# Patient Record
Sex: Male | Born: 1985
Health system: Southern US, Community
[De-identification: ages and names within clinical notes are randomized; demographics above are authoritative.]

## PROBLEM LIST (undated history)

## (undated) DIAGNOSIS — T7840XA Allergy, unspecified, initial encounter: Secondary | ICD-10-CM

## (undated) DIAGNOSIS — F419 Anxiety disorder, unspecified: Secondary | ICD-10-CM

## (undated) DIAGNOSIS — K219 Gastro-esophageal reflux disease without esophagitis: Secondary | ICD-10-CM

## (undated) HISTORY — DX: Gastro-esophageal reflux disease without esophagitis: K21.9

## (undated) HISTORY — DX: Allergy, unspecified, initial encounter: T78.40XA

## (undated) HISTORY — PX: HERNIA REPAIR: SHX51

## (undated) HISTORY — DX: Anxiety disorder, unspecified: F41.9

---
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
## 2011-09-14 ENCOUNTER — Encounter: Payer: Self-pay | Admitting: Family Medicine

## 2011-09-14 DIAGNOSIS — F419 Anxiety disorder, unspecified: Secondary | ICD-10-CM

## 2011-10-04 ENCOUNTER — Ambulatory Visit: Payer: Self-pay | Admitting: Family Medicine

## 2011-11-14 ENCOUNTER — Ambulatory Visit: Payer: 59

## 2011-11-14 ENCOUNTER — Ambulatory Visit (INDEPENDENT_AMBULATORY_CARE_PROVIDER_SITE_OTHER): Payer: 59 | Admitting: Family Medicine

## 2011-11-14 DIAGNOSIS — F419 Anxiety disorder, unspecified: Secondary | ICD-10-CM

## 2011-11-14 DIAGNOSIS — F411 Generalized anxiety disorder: Secondary | ICD-10-CM

## 2011-11-14 DIAGNOSIS — R079 Chest pain, unspecified: Secondary | ICD-10-CM

## 2011-11-14 MED ORDER — CLONAZEPAM 0.5 MG PO TABS: 0.5000 mg | ORAL_TABLET | Freq: Two times a day (BID) | ORAL | Status: DC | PRN

## 2011-11-14 NOTE — Progress Notes (Signed)
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  No known injury.  Current smoker.  No h/o asthma.  O:   NAD Heart:  No murmur, gallop, rub Chest:  Clear to auscultation, nontender.  [facility] reading (PRIMARY) by  Dr. Milus Glazier:  Neg CXR  A:  Chronic mild chest pain  P:  Try quitting smoking and see what happens.  Patient also complained about some anxiety and angst. His girlfriend has Xanax but does not share with him. Clonazepam in the past which helped some but it interferes with his memory. He is much exactly sure what that is. Discussing what he likes the most which turns out to be racing his car. After further discussion he said that he would like to try the clonazepam one more month and come back and discuss further therapy.  I agreed to refill clonazepam 0.5 #30 one daily when necessary as long as he would come back

## 2012-02-14 ENCOUNTER — Other Ambulatory Visit: Payer: Self-pay

## 2012-02-14 NOTE — Telephone Encounter (Signed)
Pt would like to have a refill on Klonopin.

## 2012-02-14 NOTE — Telephone Encounter (Signed)
PT CALLED EARLIER TODAY AND IS CALLING BACK REGARDING HIS [facility] CALL [phone]

## 2012-02-15 NOTE — Telephone Encounter (Signed)
Per Dr. Loma Boston last note, he needs to be seen for further refills

## 2012-02-16 NOTE — Telephone Encounter (Signed)
LMOM THAT PT NEEDS TO RTC

## 2013-11-25 ENCOUNTER — Encounter: Payer: Self-pay | Admitting: Physician Assistant

## 2013-11-25 ENCOUNTER — Ambulatory Visit (INDEPENDENT_AMBULATORY_CARE_PROVIDER_SITE_OTHER): Payer: 59 | Admitting: Physician Assistant

## 2013-11-25 VITALS — BP 134/81 | HR 100 | Temp 98.4°F | Resp 18 | Ht 71.5 in | Wt 215.0 lb

## 2013-11-25 DIAGNOSIS — H9203 Otalgia, bilateral: Secondary | ICD-10-CM

## 2013-11-25 DIAGNOSIS — H6980 Other specified disorders of Eustachian tube, unspecified ear: Secondary | ICD-10-CM

## 2013-11-25 DIAGNOSIS — H9209 Otalgia, unspecified ear: Secondary | ICD-10-CM

## 2013-11-25 MED ORDER — FLUTICASONE PROPIONATE 50 MCG/ACT NA SUSP: 2.0000 | Freq: Every day | NASAL | Status: DC

## 2013-11-25 NOTE — Progress Notes (Signed)
   Subjective:    Patient ID: Brandon David, male    DOB: Jan 18, 1986, 28 y.o.   MRN: [id number]   PCP: No primary provider on file.  Chief Complaint  Patient presents with  . Otalgia    bilateral x 8 weeks    Medications, allergies, past medical history, surgical history, family history, social history and problem list reviewed and updated.  HPI  8-9 weeks ago started noticing ears were itching. Used Q-tips to scratch inside the canal, and has seen blood. Sometimes has wax build up, but hasn't ever had to have his ears irrigated or debrided of cerumen. No trouble hearing. No drainage from the ears. No nasal/sinus congestion that he notes. Sometimes feels like the canals are a little swollen (ear buds are tight) Ears pop every now and again. "I've gotten pretty used to it," but not normal for him. "When I drink cold fluids, it hurts in the pit of my ears." Sees DDS regularly, and uses toothpaste for sensitive teeth.  Review of Systems No fever, chills, GI/GU symptoms.  No dizziness.    Objective:   Physical Exam  Vitals reviewed. Constitutional: He is oriented to person, place, and time. Vital signs are normal. He appears well-developed and well-nourished. He is active and cooperative. No distress.  BP 134/81  Pulse 100  Temp(Src) 98.4 F (36.9 C)  Resp 18  Ht 5' 11.5" (1.816 m)  Wt 215 lb (97.523 kg)  BMI 29.57 kg/m2  SpO2 96%  HENT:  Head: Normocephalic and atraumatic.  Right Ear: Hearing, tympanic membrane, external ear and ear canal normal.  Left Ear: Hearing, tympanic membrane, external ear and ear canal normal.  Nose: Nose normal.  Mouth/Throat: Uvula is midline, oropharynx is clear and moist and mucous membranes are normal.  Eyes: Conjunctivae are normal. No scleral icterus.  Neck: Normal range of motion. Neck supple. No thyromegaly present.  Cardiovascular: Normal rate, regular rhythm and normal heart sounds.   Pulses:      Radial pulses are 2+ on the  right side, and 2+ on the left side.  Pulmonary/Chest: Effort normal and breath sounds normal.  Lymphadenopathy:       Head (right side): No tonsillar, no preauricular, no posterior auricular and no occipital adenopathy present.       Head (left side): No tonsillar, no preauricular, no posterior auricular and no occipital adenopathy present.    He has no cervical adenopathy.       Right: No supraclavicular adenopathy present.       Left: No supraclavicular adenopathy present.  Neurological: He is alert and oriented to person, place, and time. No sensory deficit.  Skin: Skin is warm, dry and intact. No rash noted. No cyanosis or erythema. Nails show no clubbing.  Psychiatric: He has a normal mood and affect.          Assessment & Plan:  1. Otalgia of both ears No evidence of infection. Encouraged health year care, discontinuation of Q-tip use in the ear canal.  Alternatives for itchy ears encouraged.  2. ETD (eustachian tube dysfunction) Likely the cause of his discomfort.  Suspect regular use of steroid nasal spray will resolve these symptoms.  If not, consider short course of oral steroids. - fluticasone (FLONASE) 50 MCG/ACT nasal spray; Place 2 sprays into both nostrils daily.  Dispense: 16 g; Refill: 12   Fernande Brashelle S. Yancy Knoble, PA-C Physician Assistant-Certified [facility]

## 2013-11-25 NOTE — Patient Instructions (Signed)
Use the the zyrtec daily. Use the Flonase daily. When the symptoms improve, you can reduce the Zyrtec, and use it only when needed.  THE 3 SIMPLE RULES FOR NASAL SPRAY USE: 1. Don't snort. 2. Look at your toes and spray up your nose. 3. Use the opposite hand to spray in both nostrils.  Please do not insert anything into your ear that is smaller than your elbow.  This includes QTips, keys, hair pins, etc.   If your ears produce extra wax, you can help reduce the build up by:  1) allow the soapy water to drip down into your ear canals when you wash your hair (it can dissolve the wax the same way that dishwashing liquid dissolves grease), and/or  2) mix Hydrogen Peroxide half-and-half with water (DO NOT USE HYDROGEN PEROXIDE WITHOUT DILUTING IT WITH WATER as it can burn the skin in your ear); pour a little of this mixture into each ear canal while in the shower 2-3 times each week.  If your ears are itchy, place several drops of Sweet Oil into each canal to soothe the itching.  If it's not effective, place a small amount of hydrocortisone ointment on the tip of your pinky finger and rub it gently in the ear canal.  The heat of your body will melt the ointment, allowing it to spread in your ear canal and reduce the itching.

## 2017-10-21 ENCOUNTER — Emergency Department (HOSPITAL_COMMUNITY): Payer: Managed Care, Other (non HMO)

## 2017-10-21 ENCOUNTER — Encounter (HOSPITAL_COMMUNITY): Payer: Self-pay | Admitting: Emergency Medicine

## 2017-10-21 ENCOUNTER — Other Ambulatory Visit: Payer: Self-pay

## 2017-10-21 DIAGNOSIS — R079 Chest pain, unspecified: Secondary | ICD-10-CM | POA: Diagnosis present

## 2017-10-21 DIAGNOSIS — K219 Gastro-esophageal reflux disease without esophagitis: Secondary | ICD-10-CM | POA: Insufficient documentation

## 2017-10-21 LAB — CBC
HEMATOCRIT: 45.1 % (ref 39.0–52.0)
Hemoglobin: 15 g/dL (ref 13.0–17.0)
MCH: 28 pg (ref 26.0–34.0)
MCHC: 33.3 g/dL (ref 30.0–36.0)
MCV: 84.1 fL (ref 78.0–100.0)
Platelets: 203 10*3/uL (ref 150–400)
RBC: 5.36 MIL/uL (ref 4.22–5.81)
RDW: 13.4 % (ref 11.5–15.5)
WBC: 7.5 10*3/uL (ref 4.0–10.5)

## 2017-10-21 LAB — BASIC METABOLIC PANEL
Anion gap: 15 (ref 5–15)
BUN: 14 mg/dL (ref 6–20)
CO2: 22 mmol/L (ref 22–32)
Calcium: 9.7 mg/dL (ref 8.9–10.3)
Chloride: 102 mmol/L (ref 101–111)
Creatinine, Ser: 0.99 mg/dL (ref 0.61–1.24)
GFR calc Af Amer: >60 mL/min (ref >60)
GFR calc non Af Amer: >60 mL/min (ref >60)
GLUCOSE: 94 mg/dL (ref 65–99)
POTASSIUM: 3.7 mmol/L (ref 3.5–5.1)
Sodium: 139 mmol/L (ref 135–145)

## 2017-10-21 LAB — I-STAT TROPONIN, ED: Troponin i, poc: 0 ng/mL (ref 0.00–0.08)

## 2017-10-21 NOTE — ED Triage Notes (Signed)
Pt reports left sided chest pain for the past couple of weeks usually after eating.  Feels like pressure or sometimes sharp.  Denies LOC, SOB, dizziness, N/V or sweating.  Pt does have hx of GERD but is "not sure that's what it is"

## 2017-10-22 ENCOUNTER — Emergency Department (HOSPITAL_COMMUNITY)
Admission: EM | Admit: 2017-10-22 | Discharge: 2017-10-22 | Disposition: A | Discharge status: Home / Self Care | Payer: Managed Care, Other (non HMO) | Attending: Emergency Medicine | Admitting: Emergency Medicine

## 2017-10-22 DIAGNOSIS — K219 Gastro-esophageal reflux disease without esophagitis: Secondary | ICD-10-CM

## 2017-10-22 LAB — HEPATIC FUNCTION PANEL
ALK PHOS: 63 U/L (ref 38–126)
ALT: 32 U/L (ref 17–63)
AST: 30 U/L (ref 15–41)
Albumin: 4.7 g/dL (ref 3.5–5.0)
BILIRUBIN TOTAL: 0.5 mg/dL (ref 0.3–1.2)
Total Protein: 8 g/dL (ref 6.5–8.1)

## 2017-10-22 LAB — LIPASE, BLOOD: Lipase: 26 U/L (ref 11–51)

## 2017-10-22 MED ORDER — PANTOPRAZOLE SODIUM 40 MG PO TBEC
40.0000 mg | DELAYED_RELEASE_TABLET | Freq: Once | ORAL | Status: AC
  Administered 2017-10-22: 40 mg via ORAL
  Filled 2017-10-22: qty 1

## 2017-10-22 MED ORDER — SUCRALFATE 1 G PO TABS: 1.0000 g | ORAL_TABLET | Freq: Three times a day (TID) | ORAL | 0 refills | Status: DC

## 2017-10-22 MED ORDER — PANTOPRAZOLE SODIUM 20 MG PO TBEC: 40.0000 mg | DELAYED_RELEASE_TABLET | Freq: Every day | ORAL | 0 refills | Status: DC

## 2017-10-22 NOTE — ED Provider Notes (Signed)
[facility] EMERGENCY DEPARTMENT Provider Note   CSN: [id number] Arrival date & time: 10/21/17  2053     History   Chief Complaint Chief Complaint  Patient presents with  . Chest Pain    HPI Brandon David is a 32 y.o. male.  32 year old male with a history of allergies, anxiety, reflux presents to the emergency department for evaluation of chest pain.  He has had waxing and waning chest pain over the past few weeks, since he believes that his Prilosec stopped working as efficiently for him.  He has changed to different medications including Pepcid and Zantac with little improvement to his chest pain.  He describes it as burning and located in his left upper abdomen and left lower chest.  Symptoms are aggravated with eating and drinking and making his stomach feel full.  He has not had any nausea or vomiting.  No bowel changes, fevers.  No associated shortness of breath, syncope, diaphoresis.  He is scheduled to see a primary care doctor and follow-up in 2 days.  Gastroenterologist.   The history is provided by the patient. No language interpreter was used.    Past Medical History:  Diagnosis Date  . Allergy   . Anxiety   . GERD (gastroesophageal reflux disease)     Patient Active Problem List   Diagnosis Date Noted  . Anxiety 09/14/2011    Class: Acute    Past Surgical History:  Procedure Laterality Date  . HERNIA REPAIR         Home Medications    Prior to Admission medications   Medication Sig Start Date End Date Taking? Authorizing Provider  cetirizine (ZYRTEC) 10 MG tablet Take 10 mg by mouth daily as needed for allergies.    [provider]  fluticasone (FLONASE) 50 MCG/ACT nasal spray Place 2 sprays into both nostrils daily. 11/25/13   Porfirio Oar, PA-C  pantoprazole (PROTONIX) 20 MG tablet Take 2 tablets (40 mg total) by mouth daily. 10/22/17   Antony Madura, PA-C  sucralfate (CARAFATE) 1 g tablet Take 1 tablet (1 g total) by  mouth 4 (four) times daily -  with meals and at bedtime. 10/22/17   Antony Madura, PA-C    Family History Family History  Problem Relation Age of Onset  . Cancer Maternal Grandmother   . COPD Maternal Grandfather   . Heart disease Paternal Grandfather     Social History Social History   Tobacco Use  . Smoking status: Former Smoker    Packs/day: 0.75    Types: Cigarettes    Last attempt to quit: 06/16/2002    Years since quitting: 15.3  . Smokeless tobacco: Never Used  Substance Use Topics  . Alcohol use: Yes    Alcohol/week: 5.0 - 10.0 oz    Types: 10 - 20 Standard drinks or equivalent per week  . Drug use: Yes    Frequency: 7.0 times per week    Types: Marijuana     Allergies   Patient has no known allergies.   Review of Systems Review of Systems Ten systems reviewed and are negative for acute change, except as noted in the HPI.    Physical Exam Updated Vital Signs BP (!) 126/93   Pulse (!) 52   Temp 97.8 F (36.6 C) (Oral)   Resp 15   Ht 6' (1.829 m)   Wt 99.8 kg (220 lb)   SpO2 99%   BMI 29.84 kg/m   Physical Exam  Constitutional: He  is oriented to person, place, and time. He appears well-developed and well-nourished. No distress.  Nontoxic appearing and in no acute distress  HENT:  Head: Normocephalic and atraumatic.  Eyes: Conjunctivae and EOM are normal. No scleral icterus.  Neck: Normal range of motion.  Cardiovascular: Normal rate, regular rhythm and intact distal pulses.  Pulmonary/Chest: Effort normal. No stridor. No respiratory distress. He has no wheezes. He has no rales.  Lungs clear to auscultation bilaterally  Abdominal: Soft. He exhibits no distension.  Musculoskeletal: Normal range of motion.  Neurological: He is alert and oriented to person, place, and time. He exhibits normal muscle tone. Coordination normal.  Ambulatory with steady gait  Skin: Skin is warm and dry. No rash noted. He is not diaphoretic. No erythema. No pallor.    Psychiatric: He has a normal mood and affect. His behavior is normal.  Nursing note and vitals reviewed.    ED Treatments / Results  Labs (all labs ordered are listed, but only abnormal results are displayed) Labs Reviewed  HEPATIC FUNCTION PANEL - Abnormal; Notable for the following components:      Result Value   Bilirubin, Direct <0.1 (*)    All other components within normal limits  BASIC METABOLIC PANEL  CBC  LIPASE, BLOOD  I-STAT TROPONIN, ED    EKG  EKG Interpretation  Date/Time:  Wednesday October 22 2017 04:11:55 EST Ventricular Rate:  50 PR Interval:  178 QRS Duration: 127 QT Interval:  427 QTC Calculation: 390 R Axis:   8 Text Interpretation:  Sinus rhythm nonspecific IV conduction delay Confirmed by Zadie Rhine ([id number]) on 10/22/2017 4:18:18 AM       Radiology Dg Chest 2 View  Result Date: 10/21/2017 CLINICAL DATA:  Chest pain EXAM: CHEST  2 VIEW COMPARISON:  November 14, 2011 FINDINGS: There is no edema or consolidation. The heart size and pulmonary vascularity are normal. No adenopathy. No pneumothorax. No bone lesions. IMPRESSION: No edema or consolidation. Electronically Signed   By: Bretta Bang III M.D.   On: 10/21/2017 21:42    Procedures Procedures (including critical care time)  Medications Ordered in ED Medications  pantoprazole (PROTONIX) EC tablet 40 mg (40 mg Oral Given 10/22/17 0415)     Initial Impression / Assessment and Plan / ED Course  I have reviewed the triage vital signs and the nursing notes.  Pertinent labs & imaging results that were available during my care of the patient were reviewed by me and considered in my medical decision making (see chart for details).     32 year old male presents to the emergency department for evaluation of left-sided chest pain which has been present intermittently over the past few weeks.  He has history of reflux and notes worsening pain after eating or drinking, making him feel full.   Patient previously on Prilosec, but feels this has been less helpful lately.  His symptoms are atypical for cardiac etiology.  Given chronicity and reassuring workup today, low suspicion for emergent cardiac process.  EKG is without signs of acute ischemia and troponin is negative.  Remainder of his laboratory workup has also been noncontributory.  Patient received a GI cocktail at urgent care.  He reports some improvement with this.  Will start on Protonix and Carafate.  The patient has been instructed to follow-up with a primary care doctor in 2 days at his scheduled appointment.  Return precautions discussed and provided. Patient discharged in stable condition with no unaddressed concerns.   Final Clinical Impressions(s) /  ED Diagnoses   Final diagnoses:  Gastroesophageal reflux disease, esophagitis presence not specified    ED Discharge Orders        Ordered    pantoprazole (PROTONIX) 20 MG tablet  Daily     10/22/17 0357    sucralfate (CARAFATE) 1 g tablet  3 times daily with meals & bedtime     10/22/17 0357       Antony MaduraHumes, Ohm Dentler, PA-C 10/22/17 0459    Zadie RhineWickline, Donald, MD 10/22/17 [phone]

## 2017-10-23 ENCOUNTER — Other Ambulatory Visit: Payer: Self-pay

## 2017-10-23 ENCOUNTER — Ambulatory Visit: Payer: Managed Care, Other (non HMO) | Admitting: Family Medicine

## 2017-10-23 ENCOUNTER — Encounter: Payer: Self-pay | Admitting: Family Medicine

## 2017-10-23 VITALS — BP 130/84 | HR 80 | Temp 97.8°F | Ht 71.65 in | Wt 182.0 lb

## 2017-10-23 DIAGNOSIS — K219 Gastro-esophageal reflux disease without esophagitis: Secondary | ICD-10-CM

## 2017-10-23 NOTE — Patient Instructions (Signed)
     IF you received an x-ray today, you will receive an invoice from [facility]. Please contact [facility] at [phone] with questions or concerns regarding your invoice.   IF you received labwork today, you will receive an invoice from [facility]. Please contact [facility] at [phone] with questions or concerns regarding your invoice.   Our billing staff will not be able to assist you with questions regarding bills from these companies.  You will be contacted with the lab results as soon as they are available. The fastest way to get your results is to activate your My Chart account. Instructions are located on the last page of this paperwork. If you have not heard from us regarding the results in 2 weeks, please contact this office.     

## 2017-10-23 NOTE — Progress Notes (Signed)
2/21/20195:50 PM  Brandon David 05/14/1986, 32 y.o. male [id number]  Chief Complaint  Patient presents with  . Pain    has been having chest pain, has release records from [facility] diagnosed acid reflex. Says he been eating spicy food    HPI:   Patient is a 32 y.o. male with past medical history significant for GERD who presents today for ER follow-up  Seen yesterday for overall 3 weeks worsening reflux He describes his pain as mostly pressure on LLUQ that radiates up into his chest, like gas pain It started after eating lots of spicy food on the superbowl Long standing reflux, used to be well controlled with omeprazole but had not been working since this last episode Went to ER, cardiac and GI eval neg, slightly better with GI cocktail DC on pantoprazole 20mg  BID and sulcralfate QID Today he is doing better, has been able to have breakfast and lunch without reflux afterwards He does not smoke, has reduced alcohol significantly, has not had any increase in NSAID use He knows his food triggers well and has been working on his diet and losing weight He does not tend to get many nocturnal symptoms He denies any unintentional weight loss, epigastric pain, black tarry stools, diarrhea. Has had some mild constipation but that seems to be resolving, normally not an issue Has never had an UGI series or EGD  Depression screen [facility] 2/9 10/23/2017  Decreased Interest 0  Down, Depressed, Hopeless 0  PHQ - 2 Score 0    No Known Allergies  Prior to Admission medications   Medication Sig Start Date End Date Taking? Authorizing Provider  cetirizine (ZYRTEC) 10 MG tablet Take 10 mg by mouth daily as needed for allergies.   Yes [provider]  fluticasone (FLONASE) 50 MCG/ACT nasal spray Place 2 sprays into both nostrils daily. 11/25/13  Yes Jeffery, Chelle, PA-C  pantoprazole (PROTONIX) 20 MG tablet Take 2 tablets (40 mg total) by mouth daily. 10/22/17  Yes Antony Madura, PA-C    sucralfate (CARAFATE) 1 g tablet Take 1 tablet (1 g total) by mouth 4 (four) times daily -  with meals and at bedtime. 10/22/17  Yes Antony Madura, PA-C    Past Medical History:  Diagnosis Date  . Allergy   . Anxiety   . GERD (gastroesophageal reflux disease)     Past Surgical History:  Procedure Laterality Date  . HERNIA REPAIR      Social History   Tobacco Use  . Smoking status: Former Smoker    Packs/day: 0.75    Types: Cigarettes    Last attempt to quit: 06/16/2002    Years since quitting: 15.3  . Smokeless tobacco: Never Used  Substance Use Topics  . Alcohol use: Yes    Alcohol/week: 5.0 - 10.0 oz    Types: 10 - 20 Standard drinks or equivalent per week    Family History  Problem Relation Age of Onset  . Cancer Maternal Grandmother   . COPD Maternal Grandfather   . Heart disease Paternal Grandfather     Review of Systems  Constitutional: Negative for chills and fever.  Respiratory: Negative for cough and shortness of breath.   Cardiovascular: Negative for chest pain, palpitations and leg swelling.  Gastrointestinal: Positive for constipation and heartburn. Negative for abdominal pain, blood in stool, diarrhea, melena, nausea and vomiting.  Genitourinary: Negative for dysuria and hematuria.   Per hpi  OBJECTIVE:  Blood pressure 130/84, pulse 80, temperature 97.8 F (36.6  C), temperature source Oral, height 5' 11.65" (1.82 m), weight 182 lb (82.6 kg).  Physical Exam  Constitutional: He is oriented to person, place, and time and well-developed, well-nourished, and in no distress.  HENT:  Head: Normocephalic and atraumatic.  Mouth/Throat: Oropharynx is clear and moist.  Eyes: EOM are normal. Pupils are equal, round, and reactive to light.  Neck: Neck supple.  Cardiovascular: Normal rate and regular rhythm. Exam reveals no gallop and no friction rub.  No murmur heard. Pulmonary/Chest: Effort normal and breath sounds normal. He has no wheezes. He has no  rales.  Abdominal: Soft. Bowel sounds are normal. He exhibits no distension. There is no hepatosplenomegaly. There is no tenderness.  Neurological: He is alert and oriented to person, place, and time. Gait normal.  Skin: Skin is warm and dry.    ASSESSMENT and PLAN  1. Gastroesophageal reflux disease, esophagitis presence not specified Doing better on current regime. Spent more than 50% of this 25 minute visit in education and coordination of care. Discussed LFM and options for future management. Discussed weaning of sucralfate once symptoms resolved, consider UGI vs GI referral.   Return if symptoms worsen or fail to improve.    Myles LippsIrma M Santiago, MD Primary Care at [facility] [address] [place], [place] [address] Ph.  [phone] Fax [phone]

## 2017-10-24 ENCOUNTER — Encounter: Payer: Self-pay | Admitting: Family Medicine

## 2017-10-24 DIAGNOSIS — K219 Gastro-esophageal reflux disease without esophagitis: Secondary | ICD-10-CM | POA: Insufficient documentation

## 2017-10-31 ENCOUNTER — Ambulatory Visit: Payer: Managed Care, Other (non HMO) | Admitting: Family Medicine

## 2017-10-31 ENCOUNTER — Encounter: Payer: Self-pay | Admitting: Family Medicine

## 2017-10-31 VITALS — BP 132/85 | HR 94 | Temp 97.8°F | Resp 16 | Ht 71.0 in | Wt 213.0 lb

## 2017-10-31 DIAGNOSIS — K219 Gastro-esophageal reflux disease without esophagitis: Secondary | ICD-10-CM | POA: Diagnosis not present

## 2017-10-31 NOTE — Patient Instructions (Signed)
     IF you received an x-ray today, you will receive an invoice from [facility]. Please contact [facility] at [phone] with questions or concerns regarding your invoice.   IF you received labwork today, you will receive an invoice from [facility]. Please contact [facility] at [phone] with questions or concerns regarding your invoice.   Our billing staff will not be able to assist you with questions regarding bills from these companies.  You will be contacted with the lab results as soon as they are available. The fastest way to get your results is to activate your My Chart account. Instructions are located on the last page of this paperwork. If you have not heard from us regarding the results in 2 weeks, please contact this office.     

## 2017-10-31 NOTE — Progress Notes (Signed)
3/1/20198:17 AM  Brandon David [birth year], 32 y.o. male [id number]  Chief Complaint  Patient presents with  . Gastroesophageal Reflux    follow up/ pt states he feels better, pt states he did eat breakfast yesterday and felt light head that afternoon, different for him    HPI:   Patient is a 32 y.o. male with past medical history significant for GERD who presents today for followup On protonix BID Had to stop carafate as it was causing worsening of symptoms in the evening Overall doing better but yesterday had an episode late morning when he felt suddenly very tired, lightheaded, nauseous. Sat down for about 30 mins, eventually had crackers and water, symptoms resolved and was able to finish his day wo issues, he had his regular breakfast that day He also has noticed on several occassions evening symptoms about an hour after he has dinner, he denies nocturnal symptoms He is wondering if he should see GI  Depression screen PHQ 2/9 10/23/2017  Decreased Interest 0  Down, Depressed, Hopeless 0  PHQ - 2 Score 0    No Known Allergies  Prior to Admission medications   Medication Sig Start Date End Date Taking? Authorizing Provider  cetirizine (ZYRTEC) 10 MG tablet Take 10 mg by mouth daily as needed for allergies.   Yes [provider]  fluticasone (FLONASE) 50 MCG/ACT nasal spray Place 2 sprays into both nostrils daily. 11/25/13  Yes Jeffery, Chelle, PA-C  pantoprazole (PROTONIX) 20 MG tablet Take 2 tablets (40 mg total) by mouth daily. 10/22/17  Yes Antony MaduraHumes, Kelly, PA-C  sucralfate (CARAFATE) 1 g tablet Take 1 tablet (1 g total) by mouth 4 (four) times daily -  with meals and at bedtime. Patient not taking: Reported on 10/31/2017 10/22/17   Antony MaduraHumes, Kelly, PA-C    Past Medical History:  Diagnosis Date  . Allergy   . Anxiety   . GERD (gastroesophageal reflux disease)     Past Surgical History:  Procedure Laterality Date  . HERNIA REPAIR      Social History   Tobacco  Use  . Smoking status: Former Smoker    Packs/day: 0.75    Types: Cigarettes    Last attempt to quit: 06/16/2002    Years since quitting: 15.3  . Smokeless tobacco: Never Used  Substance Use Topics  . Alcohol use: Yes    Alcohol/week: 5.0 - 10.0 oz    Types: 10 - 20 Standard drinks or equivalent per week    Family History  Problem Relation Age of Onset  . Cancer Maternal Grandmother   . COPD Maternal Grandfather   . Heart disease Paternal Grandfather     ROS Per hpi  OBJECTIVE:  Blood pressure 132/85, pulse 94, temperature 97.8 F (36.6 C), temperature source Oral, resp. rate 16, height 5\' 11"  (1.803 m), weight 213 lb (96.6 kg), SpO2 97 %.  Physical Exam  Constitutional: He is oriented to person, place, and time and well-developed, well-nourished, and in no distress.  HENT:  Head: Normocephalic and atraumatic.  Mouth/Throat: Oropharynx is clear and moist.  Eyes: EOM are normal. Pupils are equal, round, and reactive to light.  Neck: Neck supple.  Pulmonary/Chest: Effort normal.  Neurological: He is alert and oriented to person, place, and time. Gait normal.  Skin: Skin is warm and dry.  Psychiatric: Mood and affect normal.  Nursing note and vitals reviewed.    ASSESSMENT and PLAN  1. Gastroesophageal reflux disease, esophagitis presence not specified Cont protonix, discussed LFM,  discussed small regular meals and increasing hydration.  - Ambulatory referral to Gastroenterology  Return if symptoms worsen or fail to improve.    Myles Lipps, MD Primary Care at [facility] [address] [place], [place] [address] Ph.  [phone] Fax [phone]

## 2017-11-17 ENCOUNTER — Telehealth: Payer: Self-pay | Admitting: Family Medicine

## 2017-11-17 NOTE — Telephone Encounter (Signed)
Copied from CRM [id number]. Topic: General - Other >> Nov 17, 2017 12:22 PM Cecelia ByarsGreen, Pama Roskos L, RMA wrote: Reason for CRM: Please re fax Referral # [id number] to fax# [phone]

## 2017-11-20 ENCOUNTER — Other Ambulatory Visit: Payer: Self-pay | Admitting: Gastroenterology

## 2017-11-20 DIAGNOSIS — R1011 Right upper quadrant pain: Secondary | ICD-10-CM

## 2017-11-28 ENCOUNTER — Encounter (HOSPITAL_COMMUNITY): Payer: Managed Care, Other (non HMO)

## 2017-11-28 ENCOUNTER — Ambulatory Visit (HOSPITAL_COMMUNITY): Payer: Managed Care, Other (non HMO)

## 2017-12-02 ENCOUNTER — Encounter (HOSPITAL_COMMUNITY)
Admission: RE | Admit: 2017-12-02 | Discharge: 2017-12-02 | Disposition: A | Discharge status: Home / Self Care | Payer: Managed Care, Other (non HMO) | Source: Ambulatory Visit | Attending: Gastroenterology | Admitting: Gastroenterology

## 2017-12-02 DIAGNOSIS — R1011 Right upper quadrant pain: Secondary | ICD-10-CM | POA: Diagnosis present

## 2017-12-02 MED ORDER — TECHNETIUM TC 99M MEBROFENIN IV KIT
4.9000 | PACK | Freq: Once | INTRAVENOUS | Status: AC | PRN
  Administered 2017-12-02: 4.9 via INTRAVENOUS

## 2017-12-17 ENCOUNTER — Other Ambulatory Visit: Payer: Self-pay | Admitting: Surgery

## 2018-01-31 HISTORY — PX: LAPAROSCOPIC CHOLECYSTECTOMY: SUR755

## 2018-04-28 ENCOUNTER — Ambulatory Visit (INDEPENDENT_AMBULATORY_CARE_PROVIDER_SITE_OTHER): Payer: Managed Care, Other (non HMO) | Admitting: Family Medicine

## 2018-04-28 ENCOUNTER — Other Ambulatory Visit: Payer: Self-pay

## 2018-04-28 ENCOUNTER — Encounter: Payer: Self-pay | Admitting: Family Medicine

## 2018-04-28 VITALS — BP 116/80 | HR 85 | Temp 98.1°F | Ht 72.0 in | Wt 219.0 lb

## 2018-04-28 DIAGNOSIS — Z0000 Encounter for general adult medical examination without abnormal findings: Secondary | ICD-10-CM | POA: Diagnosis not present

## 2018-04-28 DIAGNOSIS — Z131 Encounter for screening for diabetes mellitus: Secondary | ICD-10-CM

## 2018-04-28 DIAGNOSIS — Z13228 Encounter for screening for other metabolic disorders: Secondary | ICD-10-CM

## 2018-04-28 DIAGNOSIS — Z1329 Encounter for screening for other suspected endocrine disorder: Secondary | ICD-10-CM

## 2018-04-28 DIAGNOSIS — Z13220 Encounter for screening for lipoid disorders: Secondary | ICD-10-CM | POA: Diagnosis not present

## 2018-04-28 DIAGNOSIS — Z130 Encounter for screening for diseases of the blood and blood-forming organs and certain disorders involving the immune mechanism: Secondary | ICD-10-CM | POA: Diagnosis not present

## 2018-04-28 MED ORDER — PANTOPRAZOLE SODIUM 40 MG PO TBEC: 40.0000 mg | DELAYED_RELEASE_TABLET | Freq: Every day | ORAL | 0 refills | Status: DC

## 2018-04-28 NOTE — Progress Notes (Signed)
8/27/20194:17 PM  [facility]FLE[facility] 14-Jul-1986, 32 y.o. male [id number]  Chief Complaint  Patient presents with  . INSURANCE FORMS    HPI:   Patient is a 32 y.o. male with past medical history significant for GERD who presents today for CPE  Last CPE a long time ago Needs for [employer] Works in [employer], makes Special educational needs teacher Married No children Stopped smoking in nov 2018 Drinks daily, about 3-4 drinks a night Started to go the gym recently now that he has finally recovered from gallbladder surgery Does not see eye doctor, no concerns Needs to get back to dentist Mother had colon cancer in her late 8s No fhx prostate cancer Paternal GF died in his 73s of CAD, smoker, not healthy  Fall Risk  04/28/2018 10/23/2017  Falls in the past year? No No     Depression screen [facility] 2/9 04/28/2018 10/23/2017  Decreased Interest 0 0  Down, Depressed, Hopeless 0 0  PHQ - 2 Score 0 0    No Known Allergies  Prior to Admission medications   Medication Sig Start Date End Date Taking? Authorizing Provider  cetirizine (ZYRTEC) 10 MG tablet Take 10 mg by mouth daily as needed for allergies.   Yes [provider]  pantoprazole (PROTONIX) 20 MG tablet Take 2 tablets (40 mg total) by mouth daily. 10/22/17  Yes Antonietta Breach, PA-C    Past Medical History:  Diagnosis Date  . Allergy   . Anxiety   . GERD (gastroesophageal reflux disease)     Past Surgical History:  Procedure Laterality Date  . HERNIA REPAIR      Social History   Tobacco Use  . Smoking status: Former Smoker    Packs/day: 0.75    Types: Cigarettes    Last attempt to quit: 06/16/2002    Years since quitting: 15.8  . Smokeless tobacco: Never Used  Substance Use Topics  . Alcohol use: Yes    Alcohol/week: 10.0 - 20.0 standard drinks    Types: 10 - 20 Standard drinks or equivalent per week    Family History  Problem Relation Age of Onset  . Cancer Maternal Grandmother   . COPD Maternal  Grandfather   . Heart disease Paternal Grandfather     Review of Systems  Constitutional: Negative for chills, fever, malaise/fatigue and weight loss.  Respiratory: Negative for cough and shortness of breath.   Cardiovascular: Negative for chest pain, palpitations and leg swelling.  Gastrointestinal: Negative for abdominal pain, nausea and vomiting.  Genitourinary: Negative for dysuria and urgency.  Musculoskeletal: Negative for joint pain and myalgias.  Neurological: Negative for dizziness and headaches.  Psychiatric/Behavioral: Negative for depression and suicidal ideas. The patient is not nervous/anxious and does not have insomnia.   All other systems reviewed and are negative.    OBJECTIVE:  Blood pressure 116/80, pulse 85, temperature 98.1 F (36.7 C), temperature source Oral, height 6' (1.829 m), weight 219 lb (99.3 kg), SpO2 97 %. Body mass index is 29.7 kg/m.   Wt Readings from Last 3 Encounters:  04/28/18 219 lb (99.3 kg)  10/31/17 213 lb (96.6 kg)  10/23/17 182 lb (82.6 kg)    Visual Acuity Screening   Right eye Left eye Both eyes  Without correction: 20/20 20/30 20/15   With correction:       Physical Exam  Constitutional: He is oriented to person, place, and time. He appears well-developed and well-nourished.  HENT:  Head: Normocephalic and atraumatic.  Right Ear: Hearing,  tympanic membrane, external ear and ear canal normal.  Left Ear: Hearing, tympanic membrane, external ear and ear canal normal.  Mouth/Throat: Oropharynx is clear and moist. No oropharyngeal exudate.  Eyes: Pupils are equal, round, and reactive to light. Conjunctivae and EOM are normal.  Neck: Neck supple. No thyromegaly present.  Cardiovascular: Normal rate, regular rhythm, normal heart sounds and intact distal pulses. Exam reveals no gallop and no friction rub.  No murmur heard. Pulmonary/Chest: Effort normal and breath sounds normal. He has no wheezes. He has no rhonchi. He has no rales.    Abdominal: Soft. Bowel sounds are normal. He exhibits no distension and no mass. There is no tenderness.  Musculoskeletal: Normal range of motion. He exhibits no edema.  Lymphadenopathy:    He has no cervical adenopathy.  Neurological: He is alert and oriented to person, place, and time. He has normal strength and normal reflexes. No cranial nerve deficit. Coordination and gait normal.  Skin: Skin is warm and dry.  Psychiatric: He has a normal mood and affect.  Nursing note and vitals reviewed.   ASSESSMENT and PLAN  1. Annual physical exam Routine HCM labs ordered. HCM reviewed/discussed. Anticipatory guidance regarding healthy weight, lifestyle and choices, incl etoh given.   2. Screening for lipid disorders - Lipid panel  3. Screening for endocrine, metabolic and immunity disorder - CMP14+EGFR - TSH  4. Screening for deficiency anemia - CBC  5. Screening for diabetes mellitus - Hemoglobin A1c  Other orders - pantoprazole (PROTONIX) 40 MG tablet; Take 1 tablet (40 mg total) by mouth daily.  Return in about 1 year (around 04/29/2019) for CPE.    Rutherford Guys, MD [facility] [address], [place] [address] Ph.  [phone] Fax [phone]

## 2018-04-28 NOTE — Patient Instructions (Addendum)
   If you have lab work done today you will be contacted with your lab results within the next 2 weeks.  If you have not heard from us then please contact us. The fastest way to get your results is to register for My Chart.   IF you received an x-ray today, you will receive an invoice from [facility]. Please contact [facility] at [phone] with questions or concerns regarding your invoice.   IF you received labwork today, you will receive an invoice from [facility]. Please contact [facility] at [phone] with questions or concerns regarding your invoice.   Our billing staff will not be able to assist you with questions regarding bills from these companies.  You will be contacted with the lab results as soon as they are available. The fastest way to get your results is to activate your My Chart account. Instructions are located on the last page of this paperwork. If you have not heard from us regarding the results in 2 weeks, please contact this office.     Preventive Care 18-39 Years, Male Preventive care refers to lifestyle choices and visits with your health care provider that can promote health and wellness. What does preventive care include?  A yearly physical exam. This is also called an annual well check.  Dental exams once or twice a year.  Routine eye exams. Ask your health care provider how often you should have your eyes checked.  Personal lifestyle choices, including: ? Daily care of your teeth and gums. ? Regular physical activity. ? Eating a healthy diet. ? Avoiding tobacco and drug use. ? Limiting alcohol use. ? Practicing safe sex. What happens during an annual well check? The services and screenings done by your health care provider during your annual well check will depend on your age, overall health, lifestyle risk factors, and family history of disease. Counseling Your health care provider may ask you questions about your:  Alcohol  use.  Tobacco use.  Drug use.  Emotional well-being.  Home and relationship well-being.  Sexual activity.  Eating habits.  Work and work environment.  Screening You may have the following tests or measurements:  Height, weight, and BMI.  Blood pressure.  Lipid and cholesterol levels. These may be checked every 5 years starting at age 20.  Diabetes screening. This is done by checking your blood sugar (glucose) after you have not eaten for a while (fasting).  Skin check.  Hepatitis C blood test.  Hepatitis B blood test.  Sexually transmitted disease (STD) testing.  Discuss your test results, treatment options, and if necessary, the need for more tests with your health care provider. Vaccines Your health care provider may recommend certain vaccines, such as:  Influenza vaccine. This is recommended every year.  Tetanus, diphtheria, and acellular pertussis (Tdap, Td) vaccine. You may need a Td booster every 10 years.  Varicella vaccine. You may need this if you have not been vaccinated.  HPV vaccine. If you are 26 or younger, you may need three doses over 6 months.  Measles, mumps, and rubella (MMR) vaccine. You may need at least one dose of MMR.You may also need a second dose.  Pneumococcal 13-valent conjugate (PCV13) vaccine. You may need this if you have certain conditions and have not been vaccinated.  Pneumococcal polysaccharide (PPSV23) vaccine. You may need one or two doses if you smoke cigarettes or if you have certain conditions.  Meningococcal vaccine. One dose is recommended if you are age 19-21 years   and a first-year college student living in a residence hall, or if you have one of several medical conditions. You may also need additional booster doses.  Hepatitis A vaccine. You may need this if you have certain conditions or if you travel or work in places where you may be exposed to hepatitis A.  Hepatitis B vaccine. You may need this if you have  certain conditions or if you travel or work in places where you may be exposed to hepatitis B.  Haemophilus influenzae type b (Hib) vaccine. You may need this if you have certain risk factors.  Talk to your health care provider about which screenings and vaccines you need and how often you need them. This information is not intended to replace advice given to you by your health care provider. Make sure you discuss any questions you have with your health care provider. Document Released: 10/15/2001 Document Revised: 05/08/2016 Document Reviewed: 06/20/2015 Elsevier Interactive Patient Education  2018 Elsevier Inc.  

## 2018-04-29 ENCOUNTER — Telehealth: Payer: Self-pay

## 2018-04-29 LAB — HEMOGLOBIN A1C
Est. average glucose Bld gHb Est-mCnc: 111 mg/dL
Hgb A1c MFr Bld: 5.5 % (ref 4.8–5.6)

## 2018-04-29 LAB — CMP14+EGFR
ALT: 28 IU/L (ref 0–44)
AST: 27 IU/L (ref 0–40)
Albumin/Globulin Ratio: 1.7 (ref 1.2–2.2)
Albumin: 5 g/dL (ref 3.5–5.5)
Alkaline Phosphatase: 81 IU/L (ref 39–117)
BUN/Creatinine Ratio: 16 (ref 9–20)
BUN: 16 mg/dL (ref 6–20)
Bilirubin Total: 0.4 mg/dL (ref 0.0–1.2)
CO2: 27 mmol/L (ref 20–29)
Calcium: 9.8 mg/dL (ref 8.7–10.2)
Chloride: 97 mmol/L (ref 96–106)
Creatinine, Ser: 0.99 mg/dL (ref 0.76–1.27)
GFR calc Af Amer: 117 mL/min/{1.73_m2} (ref >59)
GFR calc non Af Amer: 101 mL/min/{1.73_m2} (ref >59)
Globulin, Total: 2.9 g/dL (ref 1.5–4.5)
Glucose: 86 mg/dL (ref 65–99)
Potassium: 4.3 mmol/L (ref 3.5–5.2)
Sodium: 141 mmol/L (ref 134–144)
Total Protein: 7.9 g/dL (ref 6.0–8.5)

## 2018-04-29 LAB — LIPID PANEL
Chol/HDL Ratio: 5.6 ratio — ABNORMAL HIGH (ref 0.0–5.0)
Cholesterol, Total: 257 mg/dL — ABNORMAL HIGH (ref 100–199)
HDL: 46 mg/dL (ref >39)
Triglycerides: 447 mg/dL — ABNORMAL HIGH (ref 0–149)

## 2018-04-29 LAB — CBC
Hematocrit: 45.3 % (ref 37.5–51.0)
Hemoglobin: 15 g/dL (ref 13.0–17.7)
MCH: 27.9 pg (ref 26.6–33.0)
MCHC: 33.1 g/dL (ref 31.5–35.7)
MCV: 84 fL (ref 79–97)
Platelets: 208 10*3/uL (ref 150–450)
RBC: 5.37 x10E6/uL (ref 4.14–5.80)
RDW: 14.2 % (ref 12.3–15.4)
WBC: 6 10*3/uL (ref 3.4–10.8)

## 2018-04-29 LAB — TSH: TSH: 1.6 u[IU]/mL (ref 0.450–4.500)

## 2018-04-29 NOTE — Telephone Encounter (Signed)
Pt physical form is ready for pick up, [address]

## 2018-05-12 NOTE — Telephone Encounter (Signed)
Pt would like the physical form to be fax to [phone] attn bennett suber

## 2018-05-15 NOTE — Telephone Encounter (Signed)
Pt states that he has picked up the labs for his physical, but he needs the actual physical form faxed to [phone] Attn: Lear NgBennet Suber  Please let pt know when completed: [phone]  Pt also states that he can provide a new copy of the form if necessary.

## 2018-05-20 ENCOUNTER — Telehealth: Payer: Self-pay

## 2018-05-20 NOTE — Telephone Encounter (Signed)
Lm for pt letting know that his form has been faxed. The hard copy of the physical form is located in the [address] front office.

## 2018-05-20 NOTE — Telephone Encounter (Signed)
Patient's concern/request has been addressed already by another provider or staff member. 

## 2018-05-20 NOTE — Telephone Encounter (Signed)
Patient stopping by after he gets off work to drop off a new form to fill out. States that he has not heard anything regarding the form that he left.

## 2018-07-09 ENCOUNTER — Other Ambulatory Visit: Payer: Self-pay

## 2018-07-09 ENCOUNTER — Encounter: Payer: Self-pay | Admitting: Emergency Medicine

## 2018-07-09 ENCOUNTER — Ambulatory Visit: Payer: Managed Care, Other (non HMO) | Admitting: Emergency Medicine

## 2018-07-09 VITALS — BP 130/79 | HR 84 | Temp 97.9°F | Resp 16 | Ht 71.25 in | Wt 233.4 lb

## 2018-07-09 DIAGNOSIS — H9202 Otalgia, left ear: Secondary | ICD-10-CM | POA: Diagnosis not present

## 2018-07-09 DIAGNOSIS — H60502 Unspecified acute noninfective otitis externa, left ear: Secondary | ICD-10-CM | POA: Insufficient documentation

## 2018-07-09 MED ORDER — AMOXICILLIN-POT CLAVULANATE 875-125 MG PO TABS: 1.0000 | ORAL_TABLET | Freq: Two times a day (BID) | ORAL | 0 refills | Status: AC

## 2018-07-09 MED ORDER — HYDROCORTISONE-ACETIC ACID 1-2 % OT SOLN: 3.0000 [drp] | Freq: Three times a day (TID) | OTIC | 1 refills | Status: DC

## 2018-07-09 NOTE — Progress Notes (Signed)
[name] 32 y.o.   Chief Complaint  Patient presents with  . Ear Problem    LEFT per patient ongoing for a while has seen ENT in the past, feel like fluid is in it    HISTORY OF PRESENT ILLNESS: This is a 32 y.o. male with a chronic left ear problems complaining of pain and discomfort for the past several days.  Has seen ENT doctor in the past for similar episodes.  HPI   Prior to Admission medications   Medication Sig Start Date End Date Taking? Authorizing Provider  pantoprazole (PROTONIX) 40 MG tablet Take 1 tablet (40 mg total) by mouth daily. 04/28/18  Yes Myles Lipps, MD  cetirizine (ZYRTEC) 10 MG tablet Take 10 mg by mouth daily as needed for allergies.    [provider]    No Known Allergies  Patient Active Problem List   Diagnosis Date Noted  . Gastroesophageal reflux disease 10/24/2017  . Anxiety 09/14/2011    Class: Acute    Past Medical History:  Diagnosis Date  . Allergy   . Anxiety   . GERD (gastroesophageal reflux disease)     Past Surgical History:  Procedure Laterality Date  . HERNIA REPAIR    . LAPAROSCOPIC CHOLECYSTECTOMY  01/2018   by Dr Magnus Ivan    Social History   Socioeconomic History  . Marital status: Single    Spouse name: n/a  . Number of children: 0  . Years of education: College  . Highest education level: Not on file  Occupational History  . Occupation: Acupuncturist: [employer]    Comment: [employer]  Social Needs  . Financial resource strain: Not on file  . Food insecurity:    Worry: Not on file    Inability: Not on file  . Transportation needs:    Medical: Not on file    Non-medical: Not on file  Tobacco Use  . Smoking status: Former Smoker    Packs/day: 0.75    Types: Cigarettes    Last attempt to quit: 06/16/2002    Years since quitting: 16.0  . Smokeless tobacco: Never Used  Substance and Sexual Activity  . Alcohol use: Yes    Alcohol/week: 10.0 - 20.0 standard  drinks    Types: 10 - 20 Standard drinks or equivalent per week  . Drug use: Yes    Frequency: 7.0 times per week    Types: Marijuana  . Sexual activity: Not on file  Lifestyle  . Physical activity:    Days per week: Not on file    Minutes per session: Not on file  . Stress: Not on file  Relationships  . Social connections:    Talks on phone: Not on file    Gets together: Not on file    Attends religious service: Not on file    Active member of club or organization: Not on file    Attends meetings of clubs or organizations: Not on file    Relationship status: Not on file  . Intimate partner violence:    Fear of current or ex partner: Not on file    Emotionally abused: Not on file    Physically abused: Not on file    Forced sexual activity: Not on file  Other Topics Concern  . Not on file  Social History Narrative   [employer] from [place].   Lives with 2 roommates.    Family History  Problem Relation Age  of Onset  . Cancer Maternal Grandmother   . COPD Maternal Grandfather   . Heart disease Paternal Grandfather      Review of Systems  Constitutional: Negative.   HENT: Positive for ear discharge and ear pain. Negative for congestion, nosebleeds and sore throat.   Eyes: Negative.  Negative for discharge and redness.  Respiratory: Negative.  Negative for cough and shortness of breath.   Cardiovascular: Negative.  Negative for chest pain and palpitations.  Gastrointestinal: Negative.  Negative for abdominal pain, diarrhea, nausea and vomiting.  Genitourinary: Negative.   Neurological: Negative.  Negative for dizziness and headaches.  Endo/Heme/Allergies: Negative.   All other systems reviewed and are negative.   Vitals:   07/09/18 1619  BP: 130/79  Pulse: 84  Resp: 16  Temp: 97.9 F (36.6 C)  SpO2: 95%    Physical Exam  Constitutional: He is oriented to person, place, and time. He appears well-nourished.  HENT:  Head: Normocephalic and  atraumatic.  Right Ear: Tympanic membrane, external ear and ear canal normal.  Left Ear: External ear normal.  Nose: Nose normal.  Mouth/Throat: Oropharynx is clear and moist.  Left ear: Hyperemic and tender external canal.  Mild erythema of tympanic membrane.  Eyes: Pupils are equal, round, and reactive to light. Conjunctivae and EOM are normal.  Neck: Normal range of motion. Neck supple.  Cardiovascular: Normal rate and regular rhythm.  Pulmonary/Chest: Effort normal.  Musculoskeletal: Normal range of motion.  Lymphadenopathy:    He has no cervical adenopathy.  Neurological: He is alert and oriented to person, place, and time.  Skin: Skin is warm and dry. Capillary refill takes less than 2 seconds.  Psychiatric: He has a normal mood and affect. His behavior is normal.  Vitals reviewed.    ASSESSMENT & PLAN: Brandon David was seen today for ear problem.  Diagnoses and all orders for this visit:  Otalgia of left ear -     amoxicillin-clavulanate (AUGMENTIN) 875-125 MG tablet; Take 1 tablet by mouth 2 (two) times daily for 7 days. -     acetic acid-hydrocortisone (VOSOL-HC) OTIC solution; Place 3 drops into the left ear 3 (three) times daily.  Acute otitis externa of left ear, unspecified type -     amoxicillin-clavulanate (AUGMENTIN) 875-125 MG tablet; Take 1 tablet by mouth 2 (two) times daily for 7 days. -     acetic acid-hydrocortisone (VOSOL-HC) OTIC solution; Place 3 drops into the left ear 3 (three) times daily.    Patient Instructions       If you have lab work done today you will be contacted with your lab results within the next 2 weeks.  If you have not heard from [place] then please contact us. The fastest way to get your results is to register for My Chart.   IF you received an x-ray today, you will receive an invoice from [facility]. Please contact [facility] at [phone] with questions or concerns regarding your invoice.   IF you received labwork  today, you will receive an invoice from [facility]. Please contact [facility] at [phone] with questions or concerns regarding your invoice.   Our billing staff will not be able to assist you with questions regarding bills from these companies.  You will be contacted with the lab results as soon as they are available. The fastest way to get your results is to activate your My Chart account. Instructions are located on the last page of this paperwork. If you have not heard from [place]  regarding the results in 2 weeks, please contact this office.     Earache, Adult An earache, or ear pain, can be caused by many things, including:  An infection.  Ear wax buildup.  Ear pressure.  Something in the ear that should not be there (foreign body).  A sore throat.  Tooth problems.  Jaw problems.  Treatment of the earache will depend on the cause. If the cause is not clear or cannot be determined, you may need to watch your symptoms until your earache goes away or until a cause is found. Follow these instructions at home: Pay attention to any changes in your symptoms. Take these actions to help with your pain:  Take or apply over-the-counter and prescription medicines only as told by your health care provider.  If you were prescribed an antibiotic medicine, use it as told by your health care provider. Do not stop using the antibiotic even if you start to feel better.  Do not put anything in your ear other than medicine that is prescribed by your health care provider.  If directed, apply heat to the affected area as often as told by your health care provider. Use the heat source that your health care provider recommends, such as a moist heat pack or a heating pad. ? Place a towel between your skin and the heat source. ? Leave the heat on for 20-30 minutes. ? Remove the heat if your skin turns bright red. This is especially important if you are unable to feel pain, heat, or cold. You may have a  greater risk of getting burned.  If directed, put ice on the ear: ? Put ice in a plastic bag. ? Place a towel between your skin and the bag. ? Leave the ice on for 20 minutes, 2-3 times a day.  Try resting in an upright position instead of lying down. This may help to reduce pressure in your ear and relieve pain.  Chew gum if it helps to relieve your ear pain.  Treat any allergies as told by your health care provider.  Keep all follow-up visits as told by your health care provider. This is important.  Contact a health care provider if:  Your pain does not improve within 2 days.  Your earache gets worse.  You have new symptoms.  You have a fever. Get help right away if:  You have a severe headache.  You have a stiff neck.  You have trouble swallowing.  You have redness or swelling behind your ear.  You have fluid or blood coming from your ear.  You have hearing loss.  You feel dizzy. This information is not intended to replace advice given to you by your health care provider. Make sure you discuss any questions you have with your health care provider. Document Released: 04/05/2004 Document Revised: 04/16/2016 Document Reviewed: 02/12/2016 Elsevier Interactive Patient Education  2018 [employer].      Edwina Barth, MD [facility]

## 2018-07-09 NOTE — Patient Instructions (Addendum)
   If you have lab work done today you will be contacted with your lab results within the next 2 weeks.  If you have not heard from us then please contact us. The fastest way to get your results is to register for My Chart.   IF you received an x-ray today, you will receive an invoice from [facility]. Please contact  Radiology at [phone] with questions or concerns regarding your invoice.   IF you received labwork today, you will receive an invoice from [facility]. Please contact [facility] at [phone] with questions or concerns regarding your invoice.   Our billing staff will not be able to assist you with questions regarding bills from these companies.  You will be contacted with the lab results as soon as they are available. The fastest way to get your results is to activate your My Chart account. Instructions are located on the last page of this paperwork. If you have not heard from us regarding the results in 2 weeks, please contact this office.     Earache, Adult An earache, or ear pain, can be caused by many things, including:  An infection.  Ear wax buildup.  Ear pressure.  Something in the ear that should not be there (foreign body).  A sore throat.  Tooth problems.  Jaw problems.  Treatment of the earache will depend on the cause. If the cause is not clear or cannot be determined, you may need to watch your symptoms until your earache goes away or until a cause is found. Follow these instructions at home: Pay attention to any changes in your symptoms. Take these actions to help with your pain:  Take or apply over-the-counter and prescription medicines only as told by your health care provider.  If you were prescribed an antibiotic medicine, use it as told by your health care provider. Do not stop using the antibiotic even if you start to feel better.  Do not put anything in your ear other than medicine that is prescribed by your health care  provider.  If directed, apply heat to the affected area as often as told by your health care provider. Use the heat source that your health care provider recommends, such as a moist heat pack or a heating pad. ? Place a towel between your skin and the heat source. ? Leave the heat on for 20-30 minutes. ? Remove the heat if your skin turns bright red. This is especially important if you are unable to feel pain, heat, or cold. You may have a greater risk of getting burned.  If directed, put ice on the ear: ? Put ice in a plastic bag. ? Place a towel between your skin and the bag. ? Leave the ice on for 20 minutes, 2-3 times a day.  Try resting in an upright position instead of lying down. This may help to reduce pressure in your ear and relieve pain.  Chew gum if it helps to relieve your ear pain.  Treat any allergies as told by your health care provider.  Keep all follow-up visits as told by your health care provider. This is important.  Contact a health care provider if:  Your pain does not improve within 2 days.  Your earache gets worse.  You have new symptoms.  You have a fever. Get help right away if:  You have a severe headache.  You have a stiff neck.  You have trouble swallowing.  You have redness or swelling behind   your ear.  You have fluid or blood coming from your ear.  You have hearing loss.  You feel dizzy. This information is not intended to replace advice given to you by your health care provider. Make sure you discuss any questions you have with your health care provider. Document Released: 04/05/2004 Document Revised: 04/16/2016 Document Reviewed: 02/12/2016 Elsevier Interactive Patient Education  2018 Elsevier Inc.  

## 2018-07-22 IMAGING — US US ABDOMEN COMPLETE
1 series · 14 of 25 positions shown · non-contrast
Comparison: None.

CLINICAL DATA: Upper abdominal pain

EXAM:
ABDOMEN ULTRASOUND COMPLETE

[Series 1: us abdomen complete · 14 of 108 slices shown]
[im 1/108]
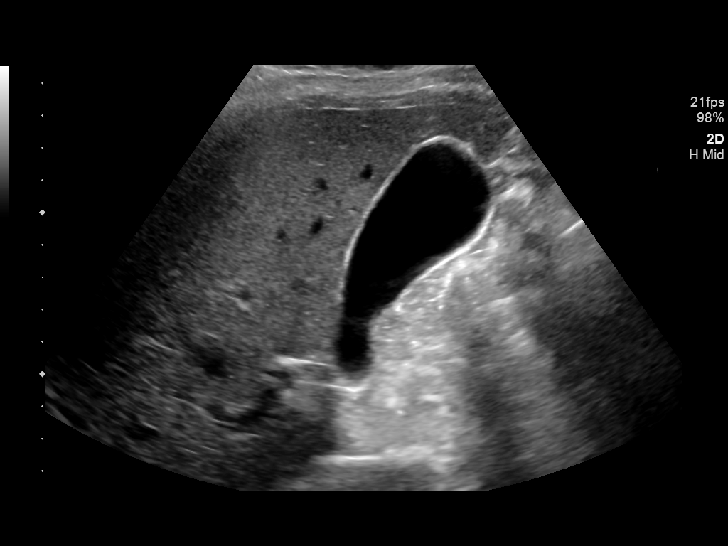
[im 9/108]
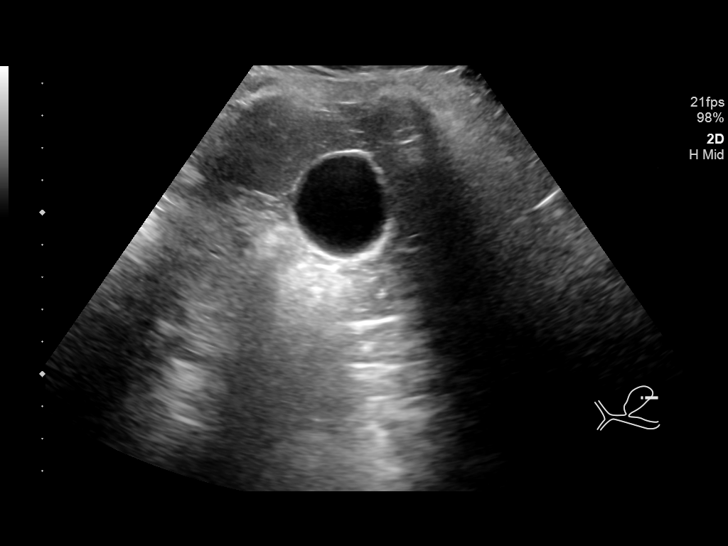
[im 18/108]
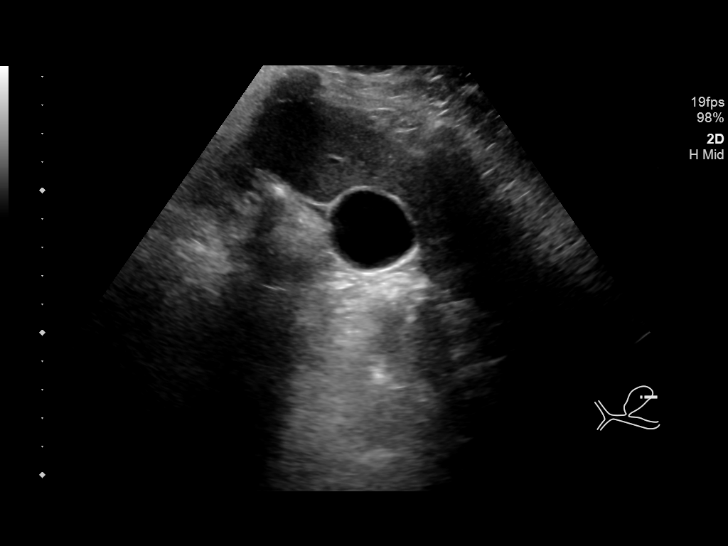
[im 27/108]
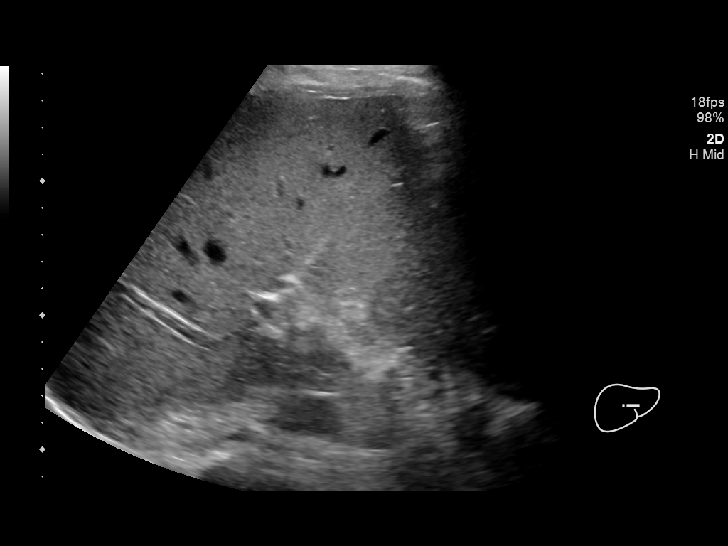
[im 36/108]
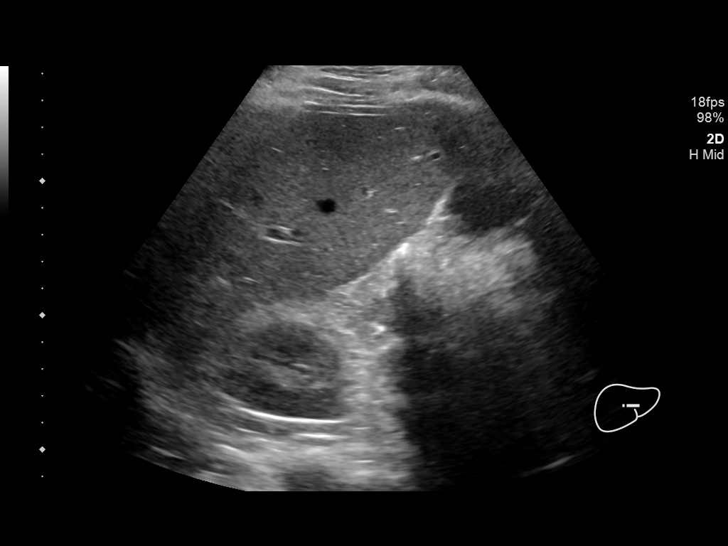
[im 41/108]
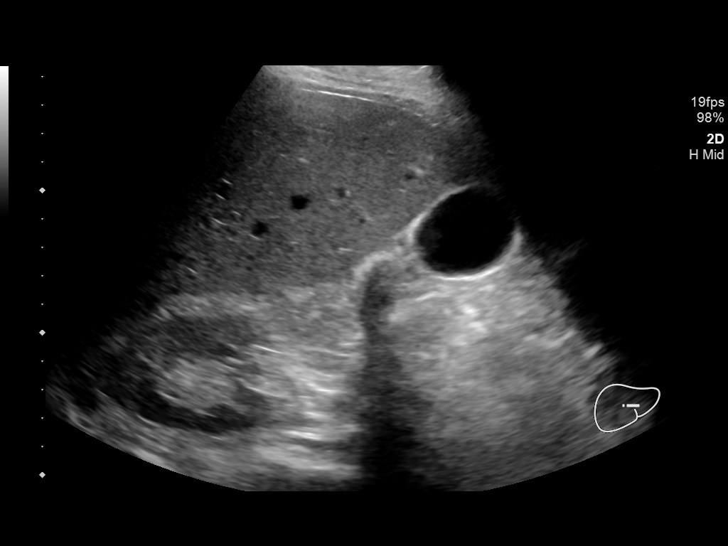
[im 50/108]
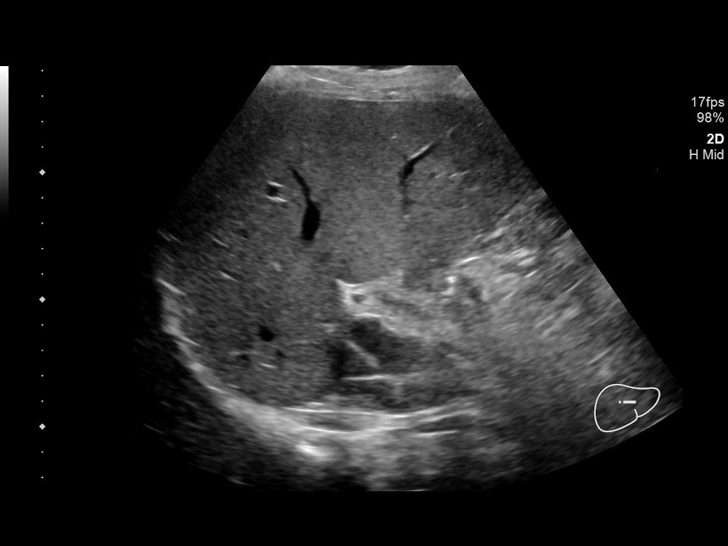
[im 58/108]
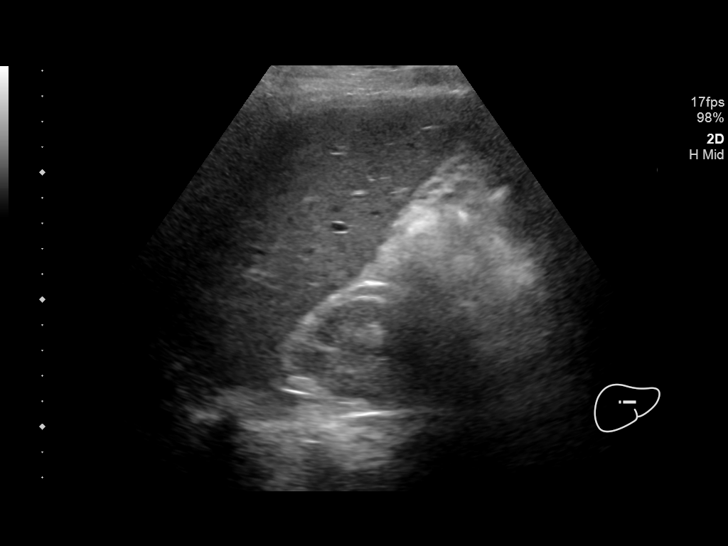
[im 67/108]
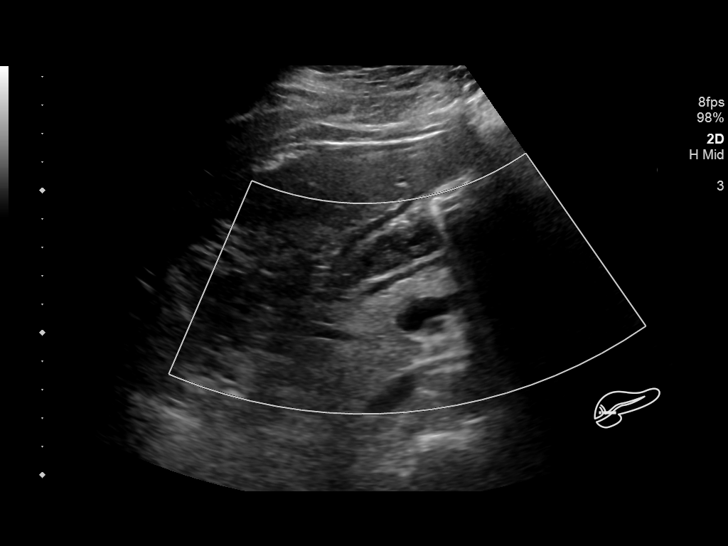
[im 72/108]
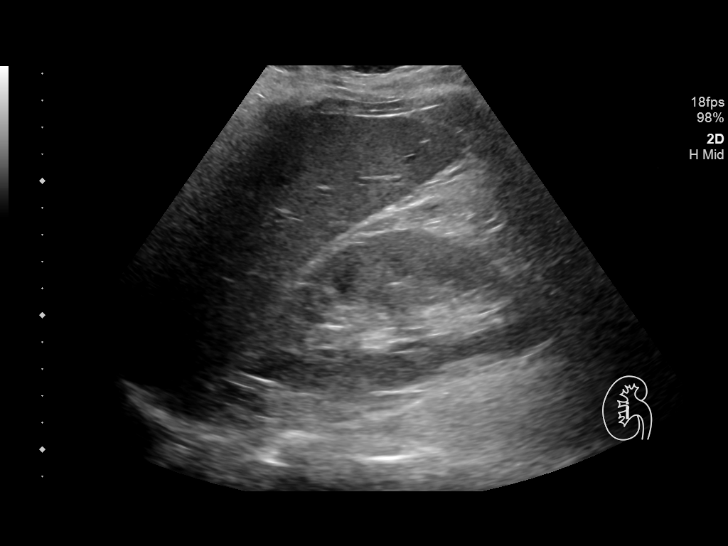
[im 81/108]
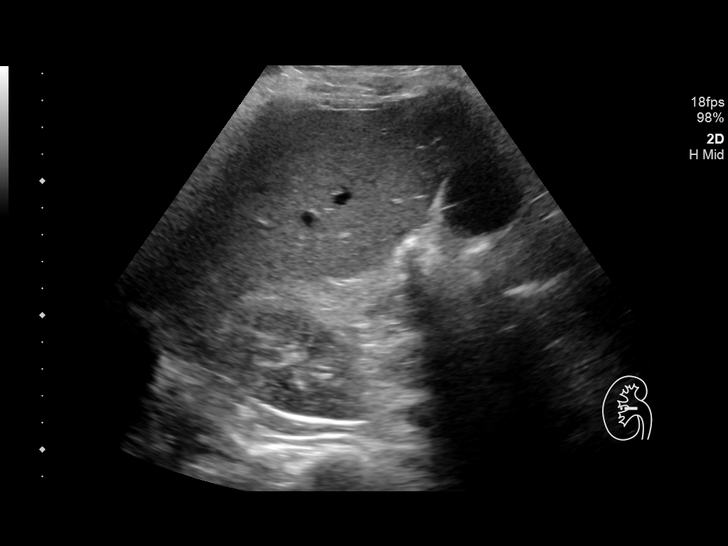
[im 90/108]
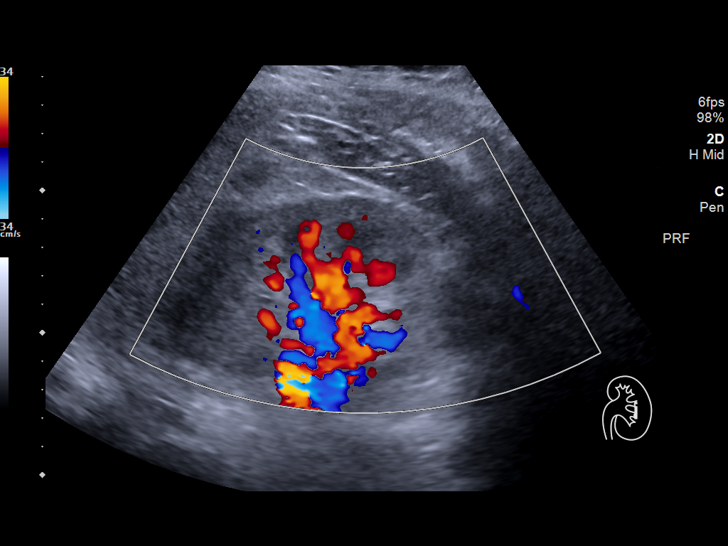
[im 99/108]
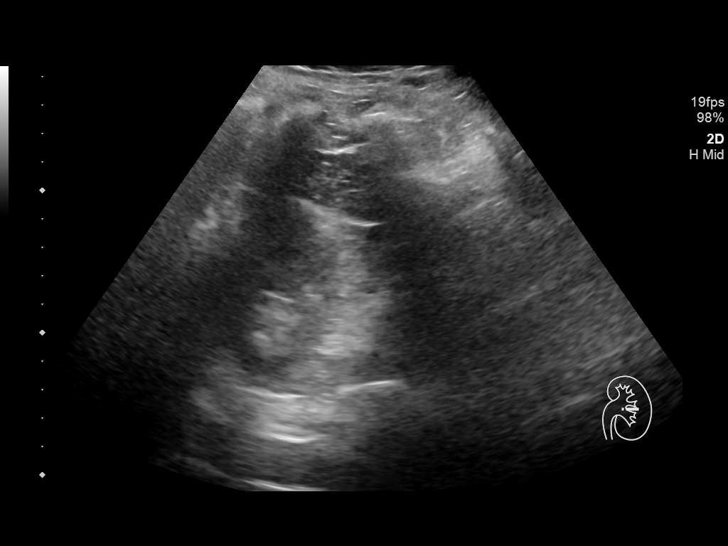
[im 108/108]
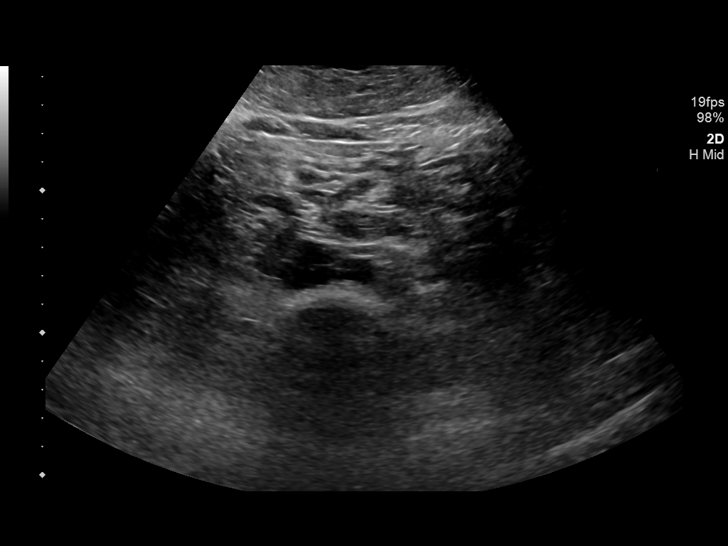

[14 of 25 positions shown; findings below may reference images not displayed]

FINDINGS: Gallbladder: No gallstones or wall thickening visualized. There is
no pericholecystic fluid. No sonographic Murphy sign noted by
sonographer.

Common bile duct: Diameter: 4 mm. No intrahepatic, common hepatic,
or common bile duct dilatation.

Liver: No focal lesion identified. Within normal limits in
parenchymal echogenicity. Portal vein is patent on color Doppler
imaging with normal direction of blood flow towards the liver.

IVC: No abnormality visualized.

Pancreas: Visualized portion unremarkable. Portions of the tail of
the pancreas are obscured by gas.

Spleen: Size and appearance within normal limits.

Right Kidney: Length: 11.2 cm. Echogenicity within normal limits. No
mass or hydronephrosis visualized.

Left Kidney: Length: 12.0 cm. Echogenicity within normal limits. No
mass or hydronephrosis visualized.

Abdominal aorta: No aneurysm visualized.

Other findings: No demonstrable ascites.
IMPRESSION: Portions of pancreas obscured by gas. Visualized portions of
pancreas appear normal. Study otherwise unremarkable.

## 2018-07-22 IMAGING — NM NM HEPATO W/GB/PHARM/[PERSON_NAME]
2 series · 12 of 12 positions shown · non-contrast
Comparison: None.

CLINICAL DATA: Right upper quadrant pain

EXAM:
NUCLEAR MEDICINE HEPATOBILIARY IMAGING WITH GALLBLADDER EF
VIEWS:
Anterior right upper quadrant
RADIOPHARMACEUTICALS:  4.9 mCi 5c-OOm  Choletec IV

[Series 1: biliary · 3.25mm/px · 6 of 60 frames shown]
[frame 6/60]
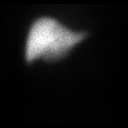
[frame 16/60]
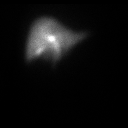
[frame 26/60]
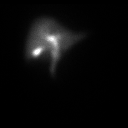
[frame 36/60]
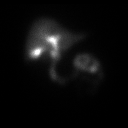
[frame 46/60]
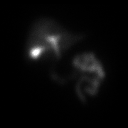
[frame 56/60]
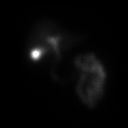

[Series 2: gbef · 3.25mm/px · 6 of 60 frames shown]
[frame 6/60]
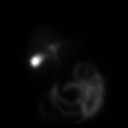
[frame 16/60]
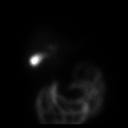
[frame 26/60]
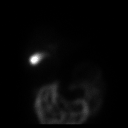
[frame 36/60]
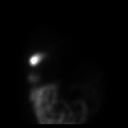
[frame 46/60]
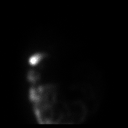
[frame 56/60]
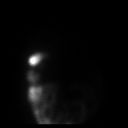

[12 of 12 positions shown; findings below may reference images not displayed]

FINDINGS: Liver uptake of radiotracer is normal. There is prompt visualization
of gallbladder and small bowel, indicating patency of the cystic and
common bile ducts. The patient consumed 8 ounces of Ensure orally
with calculation of the computer generated ejection fraction of
radiotracer from the gallbladder. The patient did not experience
clinical symptoms with the oral Ensure consumption. The computer
generated ejection fraction of radiotracer from the gallbladder is
abnormally low at 20%, normal greater than 33% using the oral agent.
IMPRESSION: Abnormally low ejection fraction of radiotracer from the
gallbladder, a finding indicative of biliary dyskinesia. Cystic and
common bile ducts are patent as is evidenced by visualization of
gallbladder and small bowel.

## 2019-08-10 ENCOUNTER — Encounter: Payer: Managed Care, Other (non HMO) | Admitting: Emergency Medicine

## 2019-08-11 ENCOUNTER — Encounter: Payer: Self-pay | Admitting: Adult Health Nurse Practitioner

## 2019-08-11 ENCOUNTER — Other Ambulatory Visit: Payer: Self-pay

## 2019-08-11 ENCOUNTER — Ambulatory Visit (INDEPENDENT_AMBULATORY_CARE_PROVIDER_SITE_OTHER): Payer: Managed Care, Other (non HMO) | Admitting: Adult Health Nurse Practitioner

## 2019-08-11 VITALS — BP 150/86 | HR 82 | Temp 98.2°F | Ht 72.0 in | Wt 237.8 lb

## 2019-08-11 DIAGNOSIS — R030 Elevated blood-pressure reading, without diagnosis of hypertension: Secondary | ICD-10-CM | POA: Insufficient documentation

## 2019-08-11 DIAGNOSIS — Z0001 Encounter for general adult medical examination with abnormal findings: Secondary | ICD-10-CM

## 2019-08-11 DIAGNOSIS — Z1329 Encounter for screening for other suspected endocrine disorder: Secondary | ICD-10-CM | POA: Diagnosis not present

## 2019-08-11 DIAGNOSIS — H6122 Impacted cerumen, left ear: Secondary | ICD-10-CM

## 2019-08-11 DIAGNOSIS — Z9049 Acquired absence of other specified parts of digestive tract: Secondary | ICD-10-CM | POA: Diagnosis not present

## 2019-08-11 DIAGNOSIS — Z23 Encounter for immunization: Secondary | ICD-10-CM

## 2019-08-11 DIAGNOSIS — Z1211 Encounter for screening for malignant neoplasm of colon: Secondary | ICD-10-CM | POA: Diagnosis not present

## 2019-08-11 DIAGNOSIS — Z0000 Encounter for general adult medical examination without abnormal findings: Secondary | ICD-10-CM

## 2019-08-11 HISTORY — DX: Encounter for general adult medical examination without abnormal findings: Z00.00

## 2019-08-11 HISTORY — DX: Acquired absence of other specified parts of digestive tract: Z90.49

## 2019-08-11 HISTORY — DX: Elevated blood-pressure reading, without diagnosis of hypertension: R03.0

## 2019-08-11 NOTE — Progress Notes (Signed)
8/27/20194:17 PM  MANDELL David [birth year], 33 y.o. male [id number]  Chief Complaint  Patient presents with  . INSURANCE FORMS    HPI:   Patient is a pleasant 33 year old male who is married.  He is getting his insurance in order to get money health insurance through his wife's work.  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  Wife works as a Dentist.  He smoked from 2003-2017 and then back from 2017-2019.  He has stopped smoking completely.  No longer smokes marijuana either.  He has had chest pain from both but that was before his gallbladder was taken out and no longer experiences that.  Gallbladder removed in 2019.  Since then has not had any real episode of chest pain.  He notes he has gained weight over the past year, probably anywhere from 12 to 17 pounds.  Blood pressure after 2 readings noted in the last 2 being in the 322G systolic ever 25K diastolic.  He does not check his blood pressure at home.  Family history of coronary artery disease and hypertension in both of his parents.  Mother had colon cancer in her 43s and is in remission and doing well.  Has a younger sister 36 months younger   Notes that he has cerumen impactions often to his left ear.  He says because he cannot keep anything out of his ears.  He is seeing otolaryngology at [facility].  He is doing a lot better from a standpoint of not putting a Q-tip in his ear.  No hearing loss today.  He does experience ordered a ringing in his ears or maybe just background noise when he goes to sleep and things are quiet.  He is not sure if that is his ears or if it is just silence.  Patient has a history of GERD takes Protonix daily.  No change.  He gets what he get [facility]   Fall Risk  04/28/2018 10/23/2017  Falls in the past year? No No     Depression screen [facility] 2/9 04/28/2018 10/23/2017  Decreased Interest 0 0  Down, Depressed, Hopeless 0 0  PHQ - 2 Score 0 0    No Known Allergies  Prior to Admission medications    Medication Sig Start Date End Date Taking? Authorizing Provider  cetirizine (ZYRTEC) 10 MG tablet Take 10 mg by mouth daily as needed for allergies.   Yes [provider]  pantoprazole (PROTONIX) 20 MG tablet Take 2 tablets (40 mg total) by mouth daily. 10/22/17  Yes Antonietta Breach, PA-C    Past Medical History:  Diagnosis Date  . Allergy   . Anxiety   . GERD (gastroesophageal reflux disease)     Past Surgical History:  Procedure Laterality Date  . HERNIA REPAIR      Social History   Tobacco Use  . Smoking status: Former Smoker    Packs/day: 0.75    Types: Cigarettes    Last attempt to quit: 06/16/2002    Years since quitting: 15.8  . Smokeless tobacco: Never Used  Substance Use Topics  . Alcohol use: Yes    Alcohol/week: 10.0 - 20.0 standard drinks    Types: 10 - 20 Standard drinks or equivalent per week    Family History  Problem Relation Age of Onset  . Cancer Maternal Grandmother   . COPD Maternal Grandfather   . Heart disease Paternal Grandfather     Review of Systems  Constitutional: Negative for chills, fever, malaise/fatigue and weight  loss.  Respiratory: Negative for cough and shortness of breath.   Cardiovascular: Negative for chest pain, palpitations and leg swelling.  Gastrointestinal: Negative for abdominal pain, nausea and vomiting.  Genitourinary: Negative for dysuria and urgency.  Musculoskeletal: Negative for joint pain and myalgias.  Neurological: Negative for dizziness and headaches.  Psychiatric/Behavioral: Negative for depression and suicidal ideas. The patient is not nervous/anxious and does not have insomnia.   All other systems reviewed and are negative.    OBJECTIVE:   height is 6' (1.829 m) and weight is 237 lb 12.8 oz (107.9 kg). His temporal temperature is 98.2 F (36.8 C). His blood pressure is 150/86 (abnormal) and his pulse is 82. His oxygen saturation is 95%.     Physical Exam  Constitutional: He is oriented to  person, place, and time. He appears well-developed and well-nourished.  HENT:  Head: Normocephalic and atraumatic.  Right Ear: Hearing, tympanic membrane, external ear and ear canal normal.  Left Ear: Hearing, tympanic membrane, external ear and ear canal normal.  Mouth/Throat: Oropharynx is clear and moist. No oropharyngeal exudate.  Eyes: Pupils are equal, round, and reactive to light. Conjunctivae and EOM are normal.  Neck: Neck supple. No thyromegaly present.  Cardiovascular: Normal rate, regular rhythm, normal heart sounds and intact distal pulses. Exam reveals no gallop and no friction rub.  No murmur heard. Pulmonary/Chest: Effort normal and breath sounds normal. He has no wheezes. He has no rhonchi. He has no rales.  Abdominal: Soft. Bowel sounds are normal. He exhibits no distension and no mass. There is no tenderness.  Musculoskeletal: Normal range of motion. He exhibits no edema.  Lymphadenopathy:    He has no cervical adenopathy.  Neurological: He is alert and oriented to person, place, and time. He has normal strength and normal reflexes. No cranial nerve deficit. Coordination and gait normal.  Skin: Skin is warm and dry.  Psychiatric: He has a normal mood and affect.  Nursing note and vitals reviewed.   ASSESSMENT and PLAN  Annual physical exam    Screening for thyroid disorder    Hx of cholecystectomy    Elevated blood pressure reading without diagnosis of hypertension     Orders Placed This Encounter  Procedures  . TDAP VACCINE  . Lipid panel  . CMP14+EGFR  . TSH  . POCT glycosylated hemoglobin (Hb A1C)   Patient is asked to monitor BP at home or work, several times per month and return with written values at next office visit.  He is satisfied with plan.  Will complete insurance paperwork when labs return.    Glyn Ade, NP

## 2019-08-11 NOTE — Patient Instructions (Addendum)
If you have lab work done today you will be contacted with your lab results within the next 2 weeks.  If you have not heard from [place] then please contact us. The fastest way to get your results is to register for My Chart.   IF you received an x-ray today, you will receive an invoice from [facility]. Please contact [facility] at [phone] with questions or concerns regarding your invoice.   IF you received labwork today, you will receive an invoice from [facility]. Please contact [facility] at [phone] with questions or concerns regarding your invoice.   Our billing staff will not be able to assist you with questions regarding bills from these companies.  You will be contacted with the lab results as soon as they are available. The fastest way to get your results is to activate your My Chart account. Instructions are located on the last page of this paperwork. If you have not heard from [place] regarding the results in 2 weeks, please contact this office.      Health Maintenance, Male Adopting a healthy lifestyle and getting preventive care are important in promoting health and wellness. Ask your health care provider about:  The right schedule for you to have regular tests and exams.  Things you can do on your own to prevent diseases and keep yourself healthy. What should I know about diet, weight, and exercise? Eat a healthy diet   Eat a diet that includes plenty of vegetables, fruits, low-fat dairy products, and lean protein.  Do not eat a lot of foods that are high in solid fats, added sugars, or sodium. Maintain a healthy weight Body mass index (BMI) is a measurement that can be used to identify possible weight problems. It estimates body fat based on height and weight. Your health care provider can help determine your BMI and help you achieve or maintain a healthy weight. Get regular exercise Get regular exercise. This is one of the most important things  you can do for your health. Most adults should:  Exercise for at least 150 minutes each week. The exercise should increase your heart rate and make you sweat (moderate-intensity exercise).  Do strengthening exercises at least twice a week. This is in addition to the moderate-intensity exercise.  Spend less time sitting. Even light physical activity can be beneficial. Watch cholesterol and blood lipids Have your blood tested for lipids and cholesterol at 33 years of age, then have this test every 5 years. You may need to have your cholesterol levels checked more often if:  Your lipid or cholesterol levels are high.  You are older than 33 years of age.  You are at high risk for heart disease. What should I know about cancer screening? Many types of cancers can be detected early and may often be prevented. Depending on your health history and family history, you may need to have cancer screening at various ages. This may include screening for:  Colorectal cancer.  Prostate cancer.  Skin cancer.  Lung cancer. What should I know about heart disease, diabetes, and high blood pressure? Blood pressure and heart disease  High blood pressure causes heart disease and increases the risk of stroke. This is more likely to develop in people who have high blood pressure readings, are of African descent, or are overweight.  Talk with your health care provider about your target blood pressure readings.  Have your blood pressure checked: ? Every 3-5 years if you are  34-31 years of age. ? Every year if you are 33 years old or older.  If you are between the ages of 76 and 54 and are a current or former smoker, ask your health care provider if you should have a one-time screening for abdominal aortic aneurysm (AAA). Diabetes Have regular diabetes screenings. This checks your fasting blood sugar level. Have the screening done:  Once every three years after age 90 if you are at a normal weight and  have a low risk for diabetes.  More often and at a younger age if you are overweight or have a high risk for diabetes. What should I know about preventing infection? Hepatitis B If you have a higher risk for hepatitis B, you should be screened for this virus. Talk with your health care provider to find out if you are at risk for hepatitis B infection. Hepatitis C Blood testing is recommended for:  Everyone born from [birth year] through 1965.  Anyone with known risk factors for hepatitis C. Sexually transmitted infections (STIs)  You should be screened each year for STIs, including gonorrhea and chlamydia, if: ? You are sexually active and are younger than 33 years of age. ? You are older than 33 years of age and your health care provider tells you that you are at risk for this type of infection. ? Your sexual activity has changed since you were last screened, and you are at increased risk for chlamydia or gonorrhea. Ask your health care provider if you are at risk.  Ask your health care provider about whether you are at high risk for HIV. Your health care provider may recommend a prescription medicine to help prevent HIV infection. If you choose to take medicine to prevent HIV, you should first get tested for HIV. You should then be tested every 3 months for as long as you are taking the medicine. Follow these instructions at home: Lifestyle  Do not use any products that contain nicotine or tobacco, such as cigarettes, e-cigarettes, and chewing tobacco. If you need help quitting, ask your health care provider.  Do not use street drugs.  Do not share needles.  Ask your health care provider for help if you need support or information about quitting drugs. Alcohol use  Do not drink alcohol if your health care provider tells you not to drink.  If you drink alcohol: ? Limit how much you have to 0-2 drinks a day. ? Be aware of how much alcohol is in your drink. In the [place] one drink equals  one 12 oz bottle of beer (355 mL), one 5 oz glass of wine (148 mL), or one 1 oz glass of hard liquor (44 mL). General instructions  Schedule regular health, dental, and eye exams.  Stay current with your vaccines.  Tell your health care provider if: ? You often feel depressed. ? You have ever been abused or do not feel safe at home. Summary  Adopting a healthy lifestyle and getting preventive care are important in promoting health and wellness.  Follow your health care provider's instructions about healthy diet, exercising, and getting tested or screened for diseases.  Follow your health care provider's instructions on monitoring your cholesterol and blood pressure. This information is not intended to replace advice given to you by your health care provider. Make sure you discuss any questions you have with your health care provider. Document Released: 02/15/2008 Document Revised: 08/12/2018 Document Reviewed: 08/12/2018 Elsevier Patient Education  2020 [employer]  Care 29-63 Years Old, Male Preventive care refers to lifestyle choices and visits with your health care provider that can promote health and wellness. This includes:  A yearly physical exam. This is also called an annual well check.  Regular dental and eye exams.  Immunizations.  Screening for certain conditions.  Healthy lifestyle choices, such as eating a healthy diet, getting regular exercise, not using drugs or products that contain nicotine and tobacco, and limiting alcohol use. What can I expect for my preventive care visit? Physical exam Your health care provider will check:  Height and weight. These may be used to calculate body mass index (BMI), which is a measurement that tells if you are at a healthy weight.  Heart rate and blood pressure.  Your skin for abnormal spots. Counseling Your health care provider may ask you questions about:  Alcohol, tobacco, and drug use.  Emotional  well-being.  Home and relationship well-being.  Sexual activity.  Eating habits.  Work and work Statistician. What immunizations do I need?  Influenza (flu) vaccine  This is recommended every year. Tetanus, diphtheria, and pertussis (Tdap) vaccine  You may need a Td booster every 10 years. Varicella (chickenpox) vaccine  You may need this vaccine if you have not already been vaccinated. Human papillomavirus (HPV) vaccine  If recommended by your health care provider, you may need three doses over 6 months. Measles, mumps, and rubella (MMR) vaccine  You may need at least one dose of MMR. You may also need a second dose. Meningococcal conjugate (MenACWY) vaccine  One dose is recommended if you are 61-52 years old and a Market researcher living in a residence hall, or if you have one of several medical conditions. You may also need additional booster doses. Pneumococcal conjugate (PCV13) vaccine  You may need this if you have certain conditions and were not previously vaccinated. Pneumococcal polysaccharide (PPSV23) vaccine  You may need one or two doses if you smoke cigarettes or if you have certain conditions. Hepatitis A vaccine  You may need this if you have certain conditions or if you travel or work in places where you may be exposed to hepatitis A. Hepatitis B vaccine  You may need this if you have certain conditions or if you travel or work in places where you may be exposed to hepatitis B. Haemophilus influenzae type b (Hib) vaccine  You may need this if you have certain risk factors. You may receive vaccines as individual doses or as more than one vaccine together in one shot (combination vaccines). Talk with your health care provider about the risks and benefits of combination vaccines. What tests do I need? Blood tests  Lipid and cholesterol levels. These may be checked every 5 years starting at age 82.  Hepatitis C test.  Hepatitis B  test. Screening   Diabetes screening. This is done by checking your blood sugar (glucose) after you have not eaten for a while (fasting).  Sexually transmitted disease (STD) testing. Talk with your health care provider about your test results, treatment options, and if necessary, the need for more tests. Follow these instructions at home: Eating and drinking   Eat a diet that includes fresh fruits and vegetables, whole grains, lean protein, and low-fat dairy products.  Take vitamin and mineral supplements as recommended by your health care provider.  Do not drink alcohol if your health care provider tells you not to drink.  If you drink alcohol: ? Limit how much you have to 0-2  drinks a day. ? Be aware of how much alcohol is in your drink. In the [place] one drink equals one 12 oz bottle of beer (355 mL), one 5 oz glass of wine (148 mL), or one 1 oz glass of hard liquor (44 mL). Lifestyle  Take daily care of your teeth and gums.  Stay active. Exercise for at least 30 minutes on 5 or more days each week.  Do not use any products that contain nicotine or tobacco, such as cigarettes, e-cigarettes, and chewing tobacco. If you need help quitting, ask your health care provider.  If you are sexually active, practice safe sex. Use a condom or other form of protection to prevent STIs (sexually transmitted infections). What's next?  Go to your health care provider once a year for a well check visit.  Ask your health care provider how often you should have your eyes and teeth checked.  Stay up to date on all vaccines. This information is not intended to replace advice given to you by your health care provider. Make sure you discuss any questions you have with your health care provider. Document Released: 10/15/2001 Document Revised: 08/13/2018 Document Reviewed: 08/13/2018 Elsevier Patient Education  2020 [employer].

## 2019-08-12 LAB — CMP14+EGFR
ALT: 35 IU/L (ref 0–44)
AST: 25 IU/L (ref 0–40)
Albumin/Globulin Ratio: 1.9 (ref 1.2–2.2)
Albumin: 5.1 g/dL — ABNORMAL HIGH (ref 4.0–5.0)
Alkaline Phosphatase: 78 IU/L (ref 39–117)
BUN/Creatinine Ratio: 14 (ref 9–20)
BUN: 13 mg/dL (ref 6–20)
Bilirubin Total: 0.6 mg/dL (ref 0.0–1.2)
CO2: 23 mmol/L (ref 20–29)
Calcium: 9.9 mg/dL (ref 8.7–10.2)
Chloride: 98 mmol/L (ref 96–106)
Creatinine, Ser: 0.95 mg/dL (ref 0.76–1.27)
GFR calc Af Amer: 121 mL/min/{1.73_m2} (ref >59)
GFR calc non Af Amer: 105 mL/min/{1.73_m2} (ref >59)
Globulin, Total: 2.7 g/dL (ref 1.5–4.5)
Glucose: 104 mg/dL — ABNORMAL HIGH (ref 65–99)
Potassium: 4.3 mmol/L (ref 3.5–5.2)
Sodium: 138 mmol/L (ref 134–144)
Total Protein: 7.8 g/dL (ref 6.0–8.5)

## 2019-08-12 LAB — LIPID PANEL
Chol/HDL Ratio: 5.8 ratio — ABNORMAL HIGH (ref 0.0–5.0)
Cholesterol, Total: 237 mg/dL — ABNORMAL HIGH (ref 100–199)
HDL: 41 mg/dL (ref >39)
LDL Chol Calc (NIH): 156 mg/dL — ABNORMAL HIGH (ref 0–99)
Triglycerides: 217 mg/dL — ABNORMAL HIGH (ref 0–149)
VLDL Cholesterol Cal: 40 mg/dL (ref 5–40)

## 2019-08-12 LAB — TSH: TSH: 1.37 u[IU]/mL (ref 0.450–4.500)

## 2019-08-16 LAB — POCT GLYCOSYLATED HEMOGLOBIN (HGB A1C): Hemoglobin A1C: 5.8 % — AB (ref 4.0–5.6)

## 2019-08-17 ENCOUNTER — Other Ambulatory Visit: Payer: Self-pay | Admitting: Adult Health Nurse Practitioner

## 2019-08-17 MED ORDER — SIMVASTATIN 10 MG PO TABS: 10.0000 mg | ORAL_TABLET | Freq: Every day | ORAL | 2 refills | Status: DC

## 2019-08-17 NOTE — Progress Notes (Signed)
Sent in Simvastatin 10g qhs to pharmacy.  Glyn Ade, NP

## 2019-09-13 ENCOUNTER — Other Ambulatory Visit: Payer: Self-pay

## 2019-09-13 ENCOUNTER — Ambulatory Visit: Payer: Managed Care, Other (non HMO) | Admitting: Family Medicine

## 2019-09-13 ENCOUNTER — Encounter: Payer: Self-pay | Admitting: Family Medicine

## 2019-09-13 VITALS — BP 157/96 | HR 84 | Temp 98.1°F | Ht 72.0 in | Wt 241.0 lb

## 2019-09-13 DIAGNOSIS — E7800 Pure hypercholesterolemia, unspecified: Secondary | ICD-10-CM | POA: Diagnosis not present

## 2019-09-13 DIAGNOSIS — I10 Essential (primary) hypertension: Secondary | ICD-10-CM | POA: Diagnosis not present

## 2019-09-13 DIAGNOSIS — R7303 Prediabetes: Secondary | ICD-10-CM | POA: Diagnosis not present

## 2019-09-13 MED ORDER — AMLODIPINE BESYLATE 5 MG PO TABS: 5.0000 mg | ORAL_TABLET | Freq: Every day | ORAL | 1 refills | Status: DC

## 2019-09-13 NOTE — Patient Instructions (Addendum)
  Nurse BP check in 2 weeks   If you have lab work done today you will be contacted with your lab results within the next 2 weeks.  If you have not heard from [place] then please contact us. The fastest way to get your results is to register for My Chart.   IF you received an x-ray today, you will receive an invoice from [facility]. Please contact [facility] at [phone] with questions or concerns regarding your invoice.   IF you received labwork today, you will receive an invoice from [facility]. Please contact [facility] at [phone] with questions or concerns regarding your invoice.   Our billing staff will not be able to assist you with questions regarding bills from these companies.  You will be contacted with the lab results as soon as they are available. The fastest way to get your results is to activate your My Chart account. Instructions are located on the last page of this paperwork. If you have not heard from [place] regarding the results in 2 weeks, please contact this office.

## 2019-09-13 NOTE — Progress Notes (Signed)
1/11/20214:37 PM  Brandon David 12/13/1985, 34 y.o., male [id number]  Chief Complaint  Patient presents with  . Follow-up    follow up on bp, last readings were a little high. Was not aware that he was suppose to taking zocor    HPI:   Patient is a 34 y.o. male who presents today for recheck on BP which was elevated last month during CPE  CPE dec 2020 - [place], NP Found to have high BP - asked to monitor at home a1c 5.8 LDL 156 - rx simvastatin but has not started Has BP cuff at home - has not been checking Eats mostly home cooked meals, tries to be mindful of salt and overall carbs He has not been exercising as much Drinks 2 drinks a day, one cup of coffee a day, rare soda No nsaids or decongestants Rarely smokes Snores but no witnesses apnea, restless sleep, waking up fatigued or daytime somnolence Strong fhx of HTN and CAD    BP Readings from Last 3 Encounters:  09/13/19 (!) 157/96  08/11/19 (!) 150/86  07/09/18 130/79   Wt Readings from Last 3 Encounters:  09/13/19 241 lb (109.3 kg)  08/11/19 237 lb 12.8 oz (107.9 kg)  07/09/18 233 lb 6.4 oz (105.9 kg)    Depression screen [facility] 2/9 09/13/2019 08/11/2019 07/09/2018  Decreased Interest 0 0 0  Down, Depressed, Hopeless 0 0 0  PHQ - 2 Score 0 0 0    Fall Risk  09/13/2019 08/11/2019 08/11/2019 07/09/2018 04/28/2018  Falls in the past year? 0 0 0 0 No  Number falls in past yr: 0 0 0 - -  Injury with Fall? 0 0 0 - -  Follow up - Falls evaluation completed Falls evaluation completed - -     No Known Allergies  Prior to Admission medications   Medication Sig Start Date End Date Taking? Authorizing Provider  cetirizine (ZYRTEC) 10 MG tablet Take 10 mg by mouth daily as needed for allergies.   Yes [provider]  pantoprazole (PROTONIX) 40 MG tablet Take 1 tablet (40 mg total) by mouth daily. 04/28/18  Yes Myles Lipps, MD  simvastatin (ZOCOR) 10 MG tablet Take 1 tablet (10 mg total) by mouth at bedtime.  08/17/19 09/16/19 Yes Royal Hawthorn, NP    Past Medical History:  Diagnosis Date  . Allergy   . Annual physical exam 08/11/2019  . Anxiety   . Elevated blood pressure reading without diagnosis of hypertension 08/11/2019  . GERD (gastroesophageal reflux disease)   . Hx of cholecystectomy 08/11/2019    Past Surgical History:  Procedure Laterality Date  . HERNIA REPAIR    . LAPAROSCOPIC CHOLECYSTECTOMY  01/2018   by Dr Magnus Ivan    Social History   Tobacco Use  . Smoking status: Former Smoker    Packs/day: 0.75    Types: Cigarettes    Start date: 08/10/2002    Quit date: 06/16/2016    Years since quitting: 3.2  . Smokeless tobacco: Never Used  Substance Use Topics  . Alcohol use: Yes    Alcohol/week: 10.0 - 20.0 standard drinks    Types: 10 - 20 Standard drinks or equivalent per week    Family History  Problem Relation Age of Onset  . Cancer Maternal Grandmother   . COPD Maternal Grandfather   . Heart disease Paternal Grandfather     Review of Systems  Constitutional: Negative for chills and fever.  Respiratory: Negative for cough and shortness  of breath.   Cardiovascular: Negative for chest pain, palpitations and leg swelling.  Gastrointestinal: Negative for abdominal pain, nausea and vomiting.     OBJECTIVE:  Today's Vitals   09/13/19 1633  BP: (!) 157/96  Pulse: 84  Temp: 98.1 F (36.7 C)  SpO2: 97%  Weight: 241 lb (109.3 kg)  Height: 6' (1.829 m)   Body mass index is 32.69 kg/m.   Physical Exam Vitals and nursing note reviewed.  Constitutional:      Appearance: He is well-developed.  HENT:     Head: Normocephalic and atraumatic.  Eyes:     Conjunctiva/sclera: Conjunctivae normal.     Pupils: Pupils are equal, round, and reactive to light.  Pulmonary:     Effort: Pulmonary effort is normal.  Musculoskeletal:     Cervical back: Neck supple.  Skin:    General: Skin is warm and dry.  Neurological:     Mental Status: He is alert and  oriented to person, place, and time.     No results found for this or any previous visit (from the past 24 hour(s)).  No results found.   ASSESSMENT and PLAN  1. Essential hypertension, benign 2. Pure hypercholesterolemia 3. Prediabetes  Starting amlodipine 5mg  a day, reviewed r/se/b. Discussed home BP monitoring. Reviewed LFM at length.   Other orders - amLODipine (NORVASC) 5 MG tablet; Take 1 tablet (5 mg total) by mouth daily.  Return in about 4 months (around 01/11/2020).    Rutherford Guys, MD Primary Care at [place], [place] [address] Ph.  [phone] Fax [phone]

## 2019-09-27 ENCOUNTER — Other Ambulatory Visit: Payer: Self-pay

## 2019-09-27 ENCOUNTER — Ambulatory Visit (INDEPENDENT_AMBULATORY_CARE_PROVIDER_SITE_OTHER): Payer: Managed Care, Other (non HMO) | Admitting: Family Medicine

## 2019-09-27 VITALS — BP 142/90 | HR 82 | Temp 98.4°F | Ht 72.0 in | Wt 240.6 lb

## 2019-09-27 DIAGNOSIS — Z0130 Encounter for examination of blood pressure without abnormal findings: Secondary | ICD-10-CM

## 2019-09-27 NOTE — Progress Notes (Signed)
BP received.

## 2019-09-27 NOTE — Patient Instructions (Signed)
° ° ° °  If you have lab work done today you will be contacted with your lab results within the next 2 weeks.  If you have not heard from us then please contact us. The fastest way to get your results is to register for My Chart. ° ° °IF you received an x-ray today, you will receive an invoice from [facility]. Please contact  Radiology at [phone] with questions or concerns regarding your invoice.  ° °IF you received labwork today, you will receive an invoice from [facility]. Please contact [facility] at [phone] with questions or concerns regarding your invoice.  ° °Our billing staff will not be able to assist you with questions regarding bills from these companies. ° °You will be contacted with the lab results as soon as they are available. The fastest way to get your results is to activate your My Chart account. Instructions are located on the last page of this paperwork. If you have not heard from us regarding the results in 2 weeks, please contact this office. °  ° ° ° °

## 2020-01-10 ENCOUNTER — Ambulatory Visit (INDEPENDENT_AMBULATORY_CARE_PROVIDER_SITE_OTHER): Payer: Managed Care, Other (non HMO) | Admitting: Family Medicine

## 2020-01-10 ENCOUNTER — Encounter: Payer: Self-pay | Admitting: Family Medicine

## 2020-01-10 ENCOUNTER — Other Ambulatory Visit: Payer: Self-pay

## 2020-01-10 VITALS — BP 134/84 | HR 74 | Temp 98.8°F | Ht 72.0 in | Wt 238.0 lb

## 2020-01-10 DIAGNOSIS — E7800 Pure hypercholesterolemia, unspecified: Secondary | ICD-10-CM

## 2020-01-10 DIAGNOSIS — I10 Essential (primary) hypertension: Secondary | ICD-10-CM

## 2020-01-10 MED ORDER — AMLODIPINE BESYLATE 5 MG PO TABS: 5.0000 mg | ORAL_TABLET | Freq: Every day | ORAL | 1 refills | Status: DC

## 2020-01-10 NOTE — Patient Instructions (Signed)
° ° ° °  If you have lab work done today you will be contacted with your lab results within the next 2 weeks.  If you have not heard from us then please contact us. The fastest way to get your results is to register for My Chart. ° ° °IF you received an x-ray today, you will receive an invoice from [facility]. Please contact [facility] at [phone] with questions or concerns regarding your invoice.  ° °IF you received labwork today, you will receive an invoice from [facility]. Please contact [facility] at [phone] with questions or concerns regarding your invoice.  ° °Our billing staff will not be able to assist you with questions regarding bills from these companies. ° °You will be contacted with the lab results as soon as they are available. The fastest way to get your results is to activate your My Chart account. Instructions are located on the last page of this paperwork. If you have not heard from us regarding the results in 2 weeks, please contact this office. °  ° ° ° °

## 2020-01-10 NOTE — Progress Notes (Signed)
5/10/20214:49 PM  Brandon David [birth year], 34 y.o., male [id number]  Chief Complaint  Patient presents with  . Hypertension    medication check     HPI:   Patient is a 34 y.o. male with past medical history significant for HTN, HLP and prediabetes who presents today for routine followup  Last OV jan 2021 - started on amlodipine 5mg  Tolerating amlodipine well wo issues Checks BP at home - similar to today Did not start statin rx by collegue in dec 2020 Working on LFM Has no acute concerns today  Lab Results  Component Value Date   CHOL 237 (H) 08/11/2019   HDL 41 08/11/2019   LDLCALC 156 (H) 08/11/2019   TRIG 217 (H) 08/11/2019   CHOLHDL 5.8 (H) 08/11/2019   The ASCVD Risk score (Goff DC Jr., et al., 2013) failed to calculate for the following reasons:   The 2013 ASCVD risk score is only valid for ages 17 to 36  Depression screen PHQ 2/9 09/27/2019 09/13/2019 08/11/2019  Decreased Interest 0 0 0  Down, Depressed, Hopeless 0 0 0  PHQ - 2 Score 0 0 0    Fall Risk  09/27/2019 09/13/2019 08/11/2019 08/11/2019 07/09/2018  Falls in the past year? 0 0 0 0 0  Number falls in past yr: 0 0 0 0 -  Injury with Fall? 0 0 0 0 -  Follow up Falls evaluation completed - Falls evaluation completed Falls evaluation completed -     No Known Allergies  Prior to Admission medications   Medication Sig Start Date End Date Taking? Authorizing Provider  amLODipine (NORVASC) 5 MG tablet Take 1 tablet (5 mg total) by mouth daily. 09/13/19   11/11/19, MD  cetirizine (ZYRTEC) 10 MG tablet Take 10 mg by mouth daily as needed for allergies.    [provider]  pantoprazole (PROTONIX) 40 MG tablet Take 1 tablet (40 mg total) by mouth daily. 04/28/18   04/30/18, MD  simvastatin (ZOCOR) 10 MG tablet Take 1 tablet (10 mg total) by mouth at bedtime. 08/17/19 09/16/19  09/18/19, NP    Past Medical History:  Diagnosis Date  . Allergy   . Annual physical exam  08/11/2019  . Anxiety   . Elevated blood pressure reading without diagnosis of hypertension 08/11/2019  . GERD (gastroesophageal reflux disease)   . Hx of cholecystectomy 08/11/2019    Past Surgical History:  Procedure Laterality Date  . HERNIA REPAIR    . LAPAROSCOPIC CHOLECYSTECTOMY  01/2018   by Dr 03/2018    Social History   Tobacco Use  . Smoking status: Former Smoker    Packs/day: 0.75    Types: Cigarettes    Start date: 08/10/2002    Quit date: 06/16/2016    Years since quitting: 3.5  . Smokeless tobacco: Never Used  Substance Use Topics  . Alcohol use: Yes    Alcohol/week: 10.0 - 20.0 standard drinks    Types: 10 - 20 Standard drinks or equivalent per week    Family History  Problem Relation Age of Onset  . Cancer Maternal Grandmother   . COPD Maternal Grandfather   . Heart disease Paternal Grandfather     Review of Systems  Constitutional: Negative for chills and fever.  Respiratory: Negative for cough and shortness of breath.   Cardiovascular: Negative for chest pain, palpitations and leg swelling.  Gastrointestinal: Negative for abdominal pain, nausea and vomiting.     OBJECTIVE:  Today's Vitals  01/10/20 1713 01/11/20 1033  BP: (!) 146/89 134/84  Pulse: 74   Temp: 98.8 F (37.1 C)   SpO2: 97%   Weight: 238 lb (108 kg)   Height: 6' (1.829 m)    Body mass index is 32.28 kg/m.     Physical Exam Vitals and nursing note reviewed.  Constitutional:      Appearance: He is well-developed.  HENT:     Head: Normocephalic and atraumatic.  Eyes:     Conjunctiva/sclera: Conjunctivae normal.     Pupils: Pupils are equal, round, and reactive to light.  Cardiovascular:     Rate and Rhythm: Normal rate and regular rhythm.     Heart sounds: No murmur. No friction rub. No gallop.   Pulmonary:     Effort: Pulmonary effort is normal.     Breath sounds: Normal breath sounds. No wheezing or rales.  Musculoskeletal:     Cervical back: Neck supple.    Skin:    General: Skin is warm and dry.  Neurological:     Mental Status: He is alert and oriented to person, place, and time.     No results found for this or any previous visit (from the past 24 hour(s)).  No results found.   ASSESSMENT and PLAN  1. Essential hypertension, benign Controlled. Continue current regime.   2. Pure hypercholesterolemia Cont working on LFM, repeat labs with CPE  Other orders - amLODipine (NORVASC) 5 MG tablet; Take 1 tablet (5 mg total) by mouth daily.  Return for CPE.    Rutherford Guys, MD Primary Care at [place], [place] [address] Ph.  [phone] Fax [phone]

## 2020-01-11 ENCOUNTER — Encounter: Payer: Self-pay | Admitting: Family Medicine

## 2020-09-25 ENCOUNTER — Encounter: Payer: Self-pay | Admitting: Registered Nurse

## 2020-09-25 ENCOUNTER — Ambulatory Visit: Payer: BC Managed Care – PPO | Admitting: Registered Nurse

## 2020-09-25 ENCOUNTER — Other Ambulatory Visit: Payer: Self-pay

## 2020-09-25 VITALS — BP 172/96 | HR 96 | Temp 98.3°F | Resp 18 | Ht 72.0 in | Wt 236.0 lb

## 2020-09-25 DIAGNOSIS — I10 Essential (primary) hypertension: Secondary | ICD-10-CM | POA: Diagnosis not present

## 2020-09-25 DIAGNOSIS — K219 Gastro-esophageal reflux disease without esophagitis: Secondary | ICD-10-CM | POA: Diagnosis not present

## 2020-09-25 MED ORDER — AMLODIPINE BESYLATE 5 MG PO TABS: 5.0000 mg | ORAL_TABLET | Freq: Every day | ORAL | 1 refills | Status: DC

## 2020-09-25 MED ORDER — ESOMEPRAZOLE MAGNESIUM 40 MG PO CPDR: 40.0000 mg | DELAYED_RELEASE_CAPSULE | Freq: Every day | ORAL | 3 refills | Status: DC

## 2020-09-25 NOTE — Patient Instructions (Signed)
° ° ° °  If you have lab work done today you will be contacted with your lab results within the next 2 weeks.  If you have not heard from us then please contact us. The fastest way to get your results is to register for My Chart. ° ° °IF you received an x-ray today, you will receive an invoice from [facility]. Please contact [facility] at [phone] with questions or concerns regarding your invoice.  ° °IF you received labwork today, you will receive an invoice from [facility]. Please contact [facility] at [phone] with questions or concerns regarding your invoice.  ° °Our billing staff will not be able to assist you with questions regarding bills from these companies. ° °You will be contacted with the lab results as soon as they are available. The fastest way to get your results is to activate your My Chart account. Instructions are located on the last page of this paperwork. If you have not heard from us regarding the results in 2 weeks, please contact this office. °  ° ° ° °

## 2020-11-10 ENCOUNTER — Ambulatory Visit: Payer: BC Managed Care – PPO | Admitting: Registered Nurse

## 2020-11-10 ENCOUNTER — Encounter: Payer: Self-pay | Admitting: Registered Nurse

## 2020-11-10 ENCOUNTER — Other Ambulatory Visit: Payer: Self-pay

## 2020-11-10 VITALS — BP 148/96 | HR 85 | Temp 98.0°F | Resp 18 | Ht 72.0 in | Wt 232.6 lb

## 2020-11-10 DIAGNOSIS — I10 Essential (primary) hypertension: Secondary | ICD-10-CM | POA: Diagnosis not present

## 2020-11-10 MED ORDER — AMLODIPINE BESYLATE 10 MG PO TABS: 10.0000 mg | ORAL_TABLET | Freq: Every day | ORAL | 3 refills | Status: DC

## 2020-11-10 NOTE — Patient Instructions (Signed)
° ° ° °  If you have lab work done today you will be contacted with your lab results within the next 2 weeks.  If you have not heard from us then please contact us. The fastest way to get your results is to register for My Chart. ° ° °IF you received an x-ray today, you will receive an invoice from [facility]. Please contact [facility] at [phone] with questions or concerns regarding your invoice.  ° °IF you received labwork today, you will receive an invoice from [facility]. Please contact [facility] at [phone] with questions or concerns regarding your invoice.  ° °Our billing staff will not be able to assist you with questions regarding bills from these companies. ° °You will be contacted with the lab results as soon as they are available. The fastest way to get your results is to activate your My Chart account. Instructions are located on the last page of this paperwork. If you have not heard from us regarding the results in 2 weeks, please contact this office. °  ° ° ° °

## 2020-11-10 NOTE — Progress Notes (Signed)
Established Patient Office Visit  Subjective:  Patient ID: Brandon David, male    DOB: 02-13-1986  Age: 35 y.o. MRN: [id number]  CC:  Chief Complaint  Patient presents with  . Medication Refill    Patient states he is here for a medication refill. Patient has no other concerns.    HPI Brandon David presents for htn  Hypertension: Patient Currently taking: amlodipine 5mg  PO qd Good effect. No AEs. Denies CV symptoms including: chest pain, shob, doe, headache, visual changes, fatigue, claudication, and dependent edema.   Previous readings and labs: BP Readings from Last 3 Encounters:  11/10/20 (!) 148/96  09/25/20 (!) 172/96  01/11/20 134/84   Lab Results  Component Value Date   CREATININE 0.95 08/11/2019      Past Medical History:  Diagnosis Date  . Allergy   . Annual physical exam 08/11/2019  . Anxiety   . Elevated blood pressure reading without diagnosis of hypertension 08/11/2019  . GERD (gastroesophageal reflux disease)   . Hx of cholecystectomy 08/11/2019    Past Surgical History:  Procedure Laterality Date  . HERNIA REPAIR    . LAPAROSCOPIC CHOLECYSTECTOMY  01/2018   by Dr 03/2018    Family History  Problem Relation Age of Onset  . Cancer Maternal Grandmother   . COPD Maternal Grandfather   . Heart disease Paternal Grandfather     Social History   Socioeconomic History  . Marital status: Married    Spouse name: n/a  . Number of children: 0  . Years of education: College  . Highest education level: Bachelor's degree (e.g., BA, AB, BS)  Occupational History  . Occupation: [employer]: [employer]  Tobacco Use  . Smoking status: Former Smoker    Packs/day: 0.75    Types: Cigarettes    Start date: 08/10/2002    Quit date: 06/16/2016    Years since quitting: 4.4  . Smokeless tobacco: Never Used  Vaping Use  . Vaping Use: Former  . Start date: 08/10/2016  . Quit date: 08/10/2018  Substance and Sexual Activity  . Alcohol use: Yes     Alcohol/week: 10.0 - 20.0 standard drinks    Types: 10 - 20 Standard drinks or equivalent per week  . Drug use: Yes    Frequency: 7.0 times per week    Types: Marijuana  . Sexual activity: Yes    Partners: Male  Other Topics Concern  . Not on file  Social History Narrative   [employer] from [place].    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 cetirizine (ZYRTEC) 10 MG tablet Take 10 mg by mouth daily as needed for allergies.    2020 esomeprazole (NEXIUM) 40 MG capsule Take 1 capsule (40 mg total) by mouth daily at 12 noon. 90 capsule 3  . pantoprazole (PROTONIX) 40 MG tablet Take 1 tablet (40 mg total) by mouth daily. 30 tablet 0  . amLODipine (NORVASC) 5 MG tablet Take 1 tablet (5 mg total) by mouth daily. 90 tablet 1   No facility-administered medications prior to visit.    No  Known Allergies  ROS Review of Systems  Constitutional: Negative.   HENT: Negative.   Eyes: Negative.   Respiratory: Negative.   Cardiovascular: Negative.   Gastrointestinal: Negative.   Genitourinary: Negative.   Musculoskeletal: Negative.   Skin: Negative.   Neurological: Negative.   Psychiatric/Behavioral: Negative.   All other systems reviewed and are negative.     Objective:    Physical Exam Constitutional:      General: He is not in acute distress.    Appearance: Normal appearance. He is normal weight. He is not ill-appearing, toxic-appearing or diaphoretic.  Cardiovascular:     Rate and Rhythm: Normal rate and regular rhythm.     Heart sounds: Normal heart sounds. No murmur heard. No friction rub. No gallop.   Pulmonary:     Effort:  Pulmonary effort is normal. No respiratory distress.     Breath sounds: Normal breath sounds. No stridor. No wheezing, rhonchi or rales.  Chest:     Chest wall: No tenderness.  Neurological:     General: No focal deficit present.     Mental Status: He is alert and oriented to person, place, and time. Mental status is at baseline.  Psychiatric:        Mood and Affect: Mood normal.        Behavior: Behavior normal.        Thought Content: Thought content normal.        Judgment: Judgment normal.     BP (!) 148/96   Pulse 85   Temp 98 F (36.7 C) (Temporal)   Resp 18   Ht 6' (1.829 m)   Wt 232 lb 9.6 oz (105.5 kg)   SpO2 97%   BMI 31.55 kg/m  Wt Readings from Last 3 Encounters:  11/10/20 232 lb 9.6 oz (105.5 kg)  09/25/20 236 lb (107 kg)  01/10/20 238 lb (108 kg)     There are no preventive care reminders to display for this patient.  There are no preventive care reminders to display for this patient.  Lab Results  Component Value Date   TSH 1.370 08/11/2019   Lab Results  Component Value Date   WBC 6.0 04/28/2018   HGB 15.0 04/28/2018   HCT 45.3 04/28/2018   MCV 84 04/28/2018   PLT 208 04/28/2018   Lab Results  Component Value Date   NA 138 08/11/2019   K 4.3 08/11/2019   CO2 23 08/11/2019   GLUCOSE 104 (H) 08/11/2019   BUN 13 08/11/2019   CREATININE 0.95 08/11/2019   BILITOT 0.6 08/11/2019   ALKPHOS 78 08/11/2019   AST 25 08/11/2019   ALT 35 08/11/2019   PROT 7.8 08/11/2019   ALBUMIN 5.1 (H) 08/11/2019   CALCIUM 9.9 08/11/2019   ANIONGAP 15 10/21/2017   Lab Results  Component Value Date   CHOL 237 (H) 08/11/2019   Lab Results  Component Value Date   HDL 41 08/11/2019   Lab Results  Component Value Date   LDLCALC 156 (H) 08/11/2019   Lab Results  Component Value Date   TRIG 217 (H) 08/11/2019   Lab Results  Component Value Date   CHOLHDL 5.8 (H) 08/11/2019   Lab Results  Component Value Date   HGBA1C 5.8 (A) 08/16/2019       Assessment & Plan:   Problem List Items Addressed This Visit   None   Visit Diagnoses    Essential hypertension, benign    -  Primary   Relevant Medications  amLODipine (NORVASC) 10 MG tablet      Meds ordered this encounter  Medications  . amLODipine (NORVASC) 10 MG tablet    Sig: Take 1 tablet (10 mg total) by mouth daily.    Dispense:  90 tablet    Refill:  3    Order Specific Question:   Supervising Provider    Answer:   Neva Seat, JEFFREY R [[id number]]    Follow-up: No follow-ups on file.   PLAN  bp running high. Increase amlodipine to 10mg  PO qd  bp check in 2-3 weeks  Return in 6 mo if wnl  Patient encouraged to call clinic with any questions, comments, or concerns.  , NP

## 2021-09-29 ENCOUNTER — Other Ambulatory Visit: Payer: Self-pay | Admitting: Registered Nurse

## 2021-09-29 DIAGNOSIS — K219 Gastro-esophageal reflux disease without esophagitis: Secondary | ICD-10-CM

## 2021-10-20 NOTE — Progress Notes (Signed)
Established Patient Office Visit  Subjective:  Patient ID: Brandon David, male    DOB: 1985-11-28  Age: 36 y.o. MRN: [id number]  CC:  Chief Complaint  Patient presents with   Hypertension    Patient states he is here for an medication refill for amlodipine and discuss pantoprazole.    HPI Brandon David presents for med refill  Hypertension: Patient Currently taking: amlodipine 5mg  po qd - has run out Good effect. No AEs. Denies CV symptoms including: chest pain, shob, doe, headache, visual changes, fatigue, claudication, and dependent edema.   Previous readings and labs: BP Readings from Last 3 Encounters:  11/10/20 (!) 148/96  09/25/20 (!) 172/96  01/11/20 134/84   Lab Results  Component Value Date   CREATININE 0.95 08/11/2019    GERD Pantoprazole 40mg  po qd Good effec,t no AE Hopes to continue   Past Medical History:  Diagnosis Date   Allergy    Annual physical exam 08/11/2019   Anxiety    Elevated blood pressure reading without diagnosis of hypertension 08/11/2019   GERD (gastroesophageal reflux disease)    Hx of cholecystectomy 08/11/2019    Past Surgical History:  Procedure Laterality Date   HERNIA REPAIR     LAPAROSCOPIC CHOLECYSTECTOMY  01/2018   by Dr Magnus IvanBlackman    Family History  Problem Relation Age of Onset   Cancer Maternal Grandmother    COPD Maternal Grandfather    Heart disease Paternal Grandfather     Social History   Socioeconomic History   Marital status: Married    Spouse name: n/a   Number of children: 0   Years of education: College   Highest education level: Bachelor's degree (e.g., BA, AB, BS)  Occupational History   Occupation: Orthoptistmployee    Employer: [employer]  Tobacco Use   Smoking status: Former    Packs/day: 0.75    Types: Cigarettes    Start date: 08/10/2002    Quit date: 06/16/2016    Years since quitting: 5.3   Smokeless tobacco: Never  Vaping Use   Vaping Use: Former   Start date: 08/10/2016   Quit date:  08/10/2018  Substance and Sexual Activity   Alcohol use: Yes    Alcohol/week: 10.0 - 20.0 standard drinks    Types: 10 - 20 Standard drinks or equivalent per week   Drug use: Yes    Frequency: 7.0 times per week    Types: Marijuana   Sexual activity: Yes    Partners: Male  Other Topics Concern   Not on file  Social History Narrative   BS [employer] from [employer].    Works with dad    married in 2019   Social Determinants of Health   Financial Resource Strain: Not on file  Food Insecurity: Not on file  Transportation Needs: Not on file  Physical Activity: Not on file  Stress: Not on file  Social Connections: Not on file  Intimate Partner Violence: Not on file    Outpatient Medications Prior to Visit  Medication Sig Dispense Refill   cetirizine (ZYRTEC) 10 MG tablet Take 10 mg by mouth daily as needed for allergies.     amLODipine (NORVASC) 5 MG tablet Take 1 tablet (5 mg total) by mouth daily. 90 tablet 1   pantoprazole (PROTONIX) 40 MG tablet Take 1 tablet (40 mg total) by mouth daily. 30 tablet 0   No facility-administered medications prior to visit.    No Known Allergies  ROS Review of Systems  Constitutional: Negative.   HENT: Negative.    Eyes: Negative.   Respiratory: Negative.    Cardiovascular: Negative.   Gastrointestinal: Negative.   Genitourinary: Negative.   Musculoskeletal: Negative.   Skin: Negative.   Neurological: Negative.   Psychiatric/Behavioral: Negative.    All other systems reviewed and are negative.    Objective:    Physical Exam Constitutional:      General: He is not in acute distress.    Appearance: Normal appearance. He is normal weight. He is not ill-appearing, toxic-appearing or diaphoretic.  Cardiovascular:     Rate and Rhythm: Normal rate and regular rhythm.     Heart sounds: Normal heart sounds. No murmur heard.   No friction rub. No gallop.  Pulmonary:     Effort: Pulmonary effort is normal. No respiratory  distress.     Breath sounds: Normal breath sounds. No stridor. No wheezing, rhonchi or rales.  Chest:     Chest wall: No tenderness.  Neurological:     General: No focal deficit present.     Mental Status: He is alert and oriented to person, place, and time. Mental status is at baseline.  Psychiatric:        Mood and Affect: Mood normal.        Behavior: Behavior normal.        Thought Content: Thought content normal.        Judgment: Judgment normal.    BP (!) 172/96    Pulse 96    Temp 98.3 F (36.8 C) (Temporal)    Resp 18    Ht 6' (1.829 m)    Wt 236 lb (107 kg)    SpO2 97%    BMI 32.01 kg/m  Wt Readings from Last 3 Encounters:  11/10/20 232 lb 9.6 oz (105.5 kg)  09/25/20 236 lb (107 kg)  01/10/20 238 lb (108 kg)     Health Maintenance Due  Topic Date Due   COVID-19 Vaccine (1) Never done   HIV Screening  Never done   Hepatitis C Screening  Never done   INFLUENZA VACCINE  04/02/2021    There are no preventive care reminders to display for this patient.  Lab Results  Component Value Date   TSH 1.370 08/11/2019   Lab Results  Component Value Date   WBC 6.0 04/28/2018   HGB 15.0 04/28/2018   HCT 45.3 04/28/2018   MCV 84 04/28/2018   PLT 208 04/28/2018   Lab Results  Component Value Date   NA 138 08/11/2019   K 4.3 08/11/2019   CO2 23 08/11/2019   GLUCOSE 104 (H) 08/11/2019   BUN 13 08/11/2019   CREATININE 0.95 08/11/2019   BILITOT 0.6 08/11/2019   ALKPHOS 78 08/11/2019   AST 25 08/11/2019   ALT 35 08/11/2019   PROT 7.8 08/11/2019   ALBUMIN 5.1 (H) 08/11/2019   CALCIUM 9.9 08/11/2019   ANIONGAP 15 10/21/2017   Lab Results  Component Value Date   CHOL 237 (H) 08/11/2019   Lab Results  Component Value Date   HDL 41 08/11/2019   Lab Results  Component Value Date   LDLCALC 156 (H) 08/11/2019   Lab Results  Component Value Date   TRIG 217 (H) 08/11/2019   Lab Results  Component Value Date   CHOLHDL 5.8 (H) 08/11/2019   Lab Results   Component Value Date   HGBA1C 5.8 (A) 08/16/2019      Assessment & Plan:   Problem List Items Addressed This  Visit       Digestive   Gastroesophageal reflux disease (Chronic)   Relevant Medications   esomeprazole (NEXIUM) 40 MG capsule   Other Visit Diagnoses     Essential hypertension, benign    -  Primary       Meds ordered this encounter  Medications   DISCONTD: amLODipine (NORVASC) 5 MG tablet    Sig: Take 1 tablet (5 mg total) by mouth daily.    Dispense:  90 tablet    Refill:  1    Order Specific Question:   Supervising Provider    Answer:   Carlota Raspberry, JEFFREY R [[id number]]   esomeprazole (NEXIUM) 40 MG capsule    Sig: Take 1 capsule (40 mg total) by mouth daily at 12 noon.    Dispense:  90 capsule    Refill:  3    Order Specific Question:   Supervising Provider    Answer:   Carlota Raspberry, JEFFREY R [[id number]]    Follow-up: No follow-ups on file.   PLAN Restart amlodipine 5mg  po qd. Bp check in 2 weeks, nurse visit. Can switch to esomepraozle 40mg  po qd Return for 3 mo bp check Patient encouraged to call clinic with any questions, comments, or concerns.  Maximiano Coss, NP

## 2021-11-26 ENCOUNTER — Other Ambulatory Visit: Payer: Self-pay | Admitting: Registered Nurse

## 2021-11-26 DIAGNOSIS — I10 Essential (primary) hypertension: Secondary | ICD-10-CM

## 2021-12-23 ENCOUNTER — Other Ambulatory Visit: Payer: Self-pay | Admitting: Registered Nurse

## 2021-12-23 DIAGNOSIS — I10 Essential (primary) hypertension: Secondary | ICD-10-CM

## 2022-01-01 ENCOUNTER — Ambulatory Visit: Payer: BC Managed Care – PPO | Admitting: Family

## 2022-01-01 ENCOUNTER — Encounter: Payer: Self-pay | Admitting: Family

## 2022-01-01 VITALS — BP 140/100 | HR 70 | Temp 97.7°F | Resp 18 | Ht 72.0 in | Wt 233.0 lb

## 2022-01-01 DIAGNOSIS — J302 Other seasonal allergic rhinitis: Secondary | ICD-10-CM

## 2022-01-01 DIAGNOSIS — Z113 Encounter for screening for infections with a predominantly sexual mode of transmission: Secondary | ICD-10-CM

## 2022-01-01 DIAGNOSIS — Z1159 Encounter for screening for other viral diseases: Secondary | ICD-10-CM

## 2022-01-01 DIAGNOSIS — K219 Gastro-esophageal reflux disease without esophagitis: Secondary | ICD-10-CM | POA: Diagnosis not present

## 2022-01-01 DIAGNOSIS — R7303 Prediabetes: Secondary | ICD-10-CM

## 2022-01-01 DIAGNOSIS — Z789 Other specified health status: Secondary | ICD-10-CM

## 2022-01-01 DIAGNOSIS — I10 Essential (primary) hypertension: Secondary | ICD-10-CM | POA: Insufficient documentation

## 2022-01-01 DIAGNOSIS — Z7689 Persons encountering health services in other specified circumstances: Secondary | ICD-10-CM | POA: Diagnosis not present

## 2022-01-01 DIAGNOSIS — E785 Hyperlipidemia, unspecified: Secondary | ICD-10-CM

## 2022-01-01 MED ORDER — ESOMEPRAZOLE MAGNESIUM 40 MG PO CPDR: 40.0000 mg | DELAYED_RELEASE_CAPSULE | Freq: Every day | ORAL | 3 refills | Status: DC

## 2022-01-01 NOTE — Progress Notes (Signed)
? ?Provider: Richarda Bladeinah Oprah Camarena FNP-C  ? ?Lezlie LyeSantiago Lago, Meda CoffeeIrma M, MD ? ?Patient Care Team: ?Lezlie LyeSantiago Lago, Meda CoffeeIrma M, MD as PCP - General (Family Medicine) ? ?Extended Emergency Contact Information ?Primary Emergency Contact: SUBER,BENNET ?Mobile Phone: [phone] ?Relation: Significant other ?Secondary Emergency Contact: Devine,CHRIS ?Address: [address] ?         [place],  [id number] ?Home Phone: [phone] ?Relation: Father ? ?Code Status:  Full Code  ?Goals of care: Advanced Directive information ?   ? View : No data to display.  ?  ?  ?  ? ? ? ?Chief Complaint  ?Patient presents with  ? Establish Care  ?  New patient here to establish care  ? ? ?HPI:  ?Pt is a 36 y.o. male seen today establish care [facility] for medical management of chronic diseases.Has a medical history of Hypertension,Hyperlipidemia ,Prediabetes,GERD,Alcohol use among others. ?He denies any acute issues. ? ?GERD - tends to have epigastric pain early in the morning and sometimes at work.symptoms are intermittent.sometimes has no symptoms for couple of weeks. ? ?Hypertension - no home blood pressure readings for review.B/p tends to be high at the Doctor's office.denies any headache,dizziness,vision changes,fatigue,chest tightness,palpitation,chest pain or shortness of breath.  ? ?Drinks coffee daily  ?Alcohol use - drinks 2 beers and one glass of scorch daily  ? ?No smoking or use of illicit drugs. ? 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 ? ?Exercises by weight lifting in the Gym.Also walks on [address] 30 minutes to an hour.   ? ? ?Past Medical History:  ?Diagnosis Date  ? Allergy   ? Annual physical exam 08/11/2019  ? Anxiety   ? Elevated blood pressure reading without diagnosis of hypertension 08/11/2019  ? GERD (gastroesophageal reflux disease)   ? Hx of cholecystectomy 08/11/2019  ? ?Past Surgical History:   ?Procedure Laterality Date  ? HERNIA REPAIR    ? LAPAROSCOPIC CHOLECYSTECTOMY  01/2018  ? by Dr Magnus IvanBlackman  ? ? ?No Known Allergies ? ?Allergies as of 01/01/2022   ?No Known Allergies ?  ? ?  ?Medication List  ?  ? ?  ? Accurate as of Jan 01, 2022 10:58 AM. If you have any questions, ask your nurse or doctor.  ?  ?  ? ?  ? ?STOP taking these medications   ? ?pantoprazole 40 MG tablet ?Commonly known as: PROTONIX ?Stopped by: Caesar Bookmaninah C Obdulio Mash, NP ?  ? ?  ? ?TAKE these medications   ? ?amLODipine 10 MG tablet ?Commonly known as: NORVASC ?TAKE 1 TABLET BY MOUTH EVERY DAY ?  ?cetirizine 10 MG tablet ?Commonly known as: ZYRTEC ?Take 10 mg by mouth daily as needed for allergies. ?  ?esomeprazole 40 MG capsule ?Commonly known as: NEXIUM ?Take 1 capsule (40 mg total) by mouth daily at 12 noon. ?  ? ?  ? ? ?Review of Systems  ?Constitutional:  Negative for appetite change, chills, fatigue, fever and unexpected weight change.  ?HENT:  Negative for congestion, dental problem, ear discharge, ear pain, facial swelling, hearing loss, nosebleeds, postnasal drip, rhinorrhea, sinus pressure, sinus pain, sneezing, sore throat, tinnitus and trouble swallowing.   ?Eyes:  Negative for pain, discharge, redness, itching and visual disturbance.  ?Respiratory:  Negative for cough, chest tightness, shortness of breath and wheezing.   ?Cardiovascular:  Negative for chest pain, palpitations and leg swelling.  ?Gastrointestinal:  Negative for abdominal distention, abdominal pain, blood in stool, constipation, diarrhea, nausea and vomiting.  ?Endocrine: Negative for cold intolerance, heat intolerance, polydipsia, polyphagia and polyuria.  ?Genitourinary:  Negative for difficulty urinating, dysuria, flank pain, frequency and urgency.  ?Musculoskeletal:  Negative for arthralgias, back pain, gait problem, joint swelling, myalgias, neck pain and neck stiffness.  ?Skin:  Negative for color change, pallor, rash and wound.  ?Neurological:  Negative for  dizziness, syncope, speech difficulty, weakness, light-headedness, numbness and headaches.  ?Hematological:  Does not bruise/bleed easily.  ?Psychiatric/Behavioral:  Negative for agitation, behavioral problems, confusion, hallucinations, self-injury, sleep disturbance and suicidal ideas. The patient is not nervous/anxious.   ? ?Immunization History  ?Administered Date(s) Administered  ? Influenza-Unspecified 04/11/2019  ? Tdap 08/11/2019  ? ?Pertinent  Health Maintenance Due  ?Topic Date Due  ? INFLUENZA VACCINE  04/02/2022  ? ? ?  09/27/2019  ?  4:28 PM 01/10/2020  ?  5:14 PM 09/25/2020  ?  4:40 PM 11/10/2020  ?  9:38 AM 01/01/2022  ? 10:00 AM  ?Fall Risk  ?Falls in the past year? 0 0 0 0 0  ?Was there an injury with Fall? 0 0 0 0 0  ?Fall Risk Category Calculator 0 0 0 0   ?Fall Risk Category Low Low Low Low   ?Patient Fall Risk Level Low fall risk Low fall risk   Low fall risk  ?Patient at Risk for Falls Due to     No Fall Risks  ?Fall risk Follow up Falls evaluation completed Falls evaluation completed Falls evaluation completed Falls evaluation completed Falls evaluation completed  ? ?Functional Status Survey: ?  ? ?Vitals:  ? 01/01/22 1013  ?BP: (!) 140/100  ?Pulse: 70  ?Resp: 18  ?Temp: 97.7 ?F (36.5 ?C)  ?SpO2: 97%  ?Weight: 233 lb (105.7 kg)  ?Height: 6' (1.829 m)  ? ?Body mass index is 31.6 kg/m?Marland Kitchen ?Physical Exam ?Vitals reviewed.  ?Constitutional:   ?   General: He is not in acute distress. ?   Appearance: Normal appearance. He is normal weight. He is not ill-appearing or diaphoretic.  ?HENT:  ?   Head: Normocephalic.  ?   Right Ear: Tympanic membrane, ear canal and external ear normal. There is no impacted cerumen.  ?   Left Ear: Tympanic membrane, ear canal and external ear normal. There is no impacted cerumen.  ?   Nose: Nose normal. No congestion or rhinorrhea.  ?   Mouth/Throat:  ?   Mouth: Mucous membranes are moist.  ?   Pharynx: Oropharynx is clear. No oropharyngeal exudate or posterior oropharyngeal  erythema.  ?Eyes:  ?   General: No scleral icterus.    ?   Right eye: No discharge.     ?   Left eye: No discharge.  ?   Extraocular Movements: Extraocular movements intact.  ?   Conjunctiva/sclera: Conjunctivae normal.  ?   Pupils: Pupils are equal, round, and reactive to light.  ?Neck:  ?   Vascular: No carotid bruit.  ?Cardiovascular:  ?   Rate and Rhythm: Normal rate and regular rhythm.  ?   Pulses: Normal pulses.  ?   Heart sounds: Normal heart sounds. No murmur heard. ?  No friction rub. No gallop.  ?Pulmonary:  ?   Effort: Pulmonary effort is normal. No respiratory distress.  ?   Breath sounds: Normal breath sounds. No wheezing, rhonchi or rales.  ?Chest:  ?   Chest wall: No tenderness.  ?Abdominal:  ?  General: Bowel sounds are normal. There is no distension.  ?   Palpations: Abdomen is soft. There is no mass.  ?   Tenderness: There is no abdominal tenderness. There is no right CVA tenderness, left CVA tenderness, guarding or rebound.  ?Musculoskeletal:     ?   General: No swelling or tenderness. Normal range of motion.  ?   Cervical back: Normal range of motion. No rigidity or tenderness.  ?   Right lower leg: No edema.  ?   Left lower leg: No edema.  ?Lymphadenopathy:  ?   Cervical: No cervical adenopathy.  ?Skin: ?   General: Skin is warm and dry.  ?   Coloration: Skin is not pale.  ?   Findings: No bruising, erythema, lesion or rash.  ?Neurological:  ?   Mental Status: He is alert and oriented to person, place, and time.  ?   Cranial Nerves: No cranial nerve deficit.  ?   Sensory: No sensory deficit.  ?   Motor: No weakness.  ?   Coordination: Coordination normal.  ?   Gait: Gait normal.  ?Psychiatric:     ?   Mood and Affect: Mood normal.     ?   Speech: Speech normal.     ?   Behavior: Behavior normal.     ?   Thought Content: Thought content normal.     ?   Judgment: Judgment normal.  ? ? ?Labs reviewed: ?No results for input(s): NA, K, CL, CO2, GLUCOSE, BUN, CREATININE, CALCIUM, MG, PHOS in the last  8760 hours. ?No results for input(s): AST, ALT, ALKPHOS, BILITOT, PROT, ALBUMIN in the last 8760 hours. ?No results for input(s): WBC, NEUTROABS, HGB, HCT, MCV, PLT in the last 8760 hours. ?Lab Results  ?Compone

## 2022-01-08 ENCOUNTER — Other Ambulatory Visit: Payer: Self-pay | Admitting: Registered Nurse

## 2022-01-08 DIAGNOSIS — I10 Essential (primary) hypertension: Secondary | ICD-10-CM

## 2022-01-28 ENCOUNTER — Other Ambulatory Visit: Payer: Self-pay | Admitting: Registered Nurse

## 2022-01-28 DIAGNOSIS — I10 Essential (primary) hypertension: Secondary | ICD-10-CM

## 2022-02-19 ENCOUNTER — Other Ambulatory Visit: Payer: Self-pay | Admitting: Family

## 2022-02-19 DIAGNOSIS — E785 Hyperlipidemia, unspecified: Secondary | ICD-10-CM

## 2022-02-19 DIAGNOSIS — R7303 Prediabetes: Secondary | ICD-10-CM

## 2022-02-19 DIAGNOSIS — I10 Essential (primary) hypertension: Secondary | ICD-10-CM

## 2022-02-19 DIAGNOSIS — Z113 Encounter for screening for infections with a predominantly sexual mode of transmission: Secondary | ICD-10-CM

## 2022-02-19 DIAGNOSIS — Z1159 Encounter for screening for other viral diseases: Secondary | ICD-10-CM

## 2022-02-20 ENCOUNTER — Other Ambulatory Visit: Payer: BC Managed Care – PPO

## 2022-02-20 DIAGNOSIS — Z113 Encounter for screening for infections with a predominantly sexual mode of transmission: Secondary | ICD-10-CM | POA: Diagnosis not present

## 2022-02-20 DIAGNOSIS — Z1159 Encounter for screening for other viral diseases: Secondary | ICD-10-CM | POA: Diagnosis not present

## 2022-02-20 DIAGNOSIS — E785 Hyperlipidemia, unspecified: Secondary | ICD-10-CM | POA: Diagnosis not present

## 2022-02-20 DIAGNOSIS — R7303 Prediabetes: Secondary | ICD-10-CM | POA: Diagnosis not present

## 2022-02-21 LAB — COMPLETE METABOLIC PANEL WITH GFR
AG Ratio: 1.7 (calc) (ref 1.0–2.5)
ALT: 27 U/L (ref 9–46)
AST: 22 U/L (ref 10–40)
Albumin: 5.1 g/dL (ref 3.6–5.1)
Alkaline phosphatase (APISO): 78 U/L (ref 36–130)
BUN: 11 mg/dL (ref 7–25)
CO2: 27 mmol/L (ref 20–32)
Calcium: 9.8 mg/dL (ref 8.6–10.3)
Chloride: 102 mmol/L (ref 98–110)
Creat: 0.94 mg/dL (ref 0.60–1.26)
Globulin: 3 g/dL (calc) (ref 1.9–3.7)
Glucose, Bld: 89 mg/dL (ref 65–99)
Potassium: 4.4 mmol/L (ref 3.5–5.3)
Sodium: 139 mmol/L (ref 135–146)
Total Bilirubin: 0.7 mg/dL (ref 0.2–1.2)
Total Protein: 8.1 g/dL (ref 6.1–8.1)
eGFR: 108 mL/min/{1.73_m2} (ref >=60)

## 2022-02-21 LAB — CBC WITH DIFFERENTIAL/PLATELET
Absolute Monocytes: 391 cells/uL (ref 200–950)
Basophils Absolute: 32 cells/uL (ref 0–200)
Basophils Relative: 0.7 %
Eosinophils Absolute: 101 cells/uL (ref 15–500)
Eosinophils Relative: 2.2 %
HCT: 43.3 % (ref 38.5–50.0)
Hemoglobin: 14.6 g/dL (ref 13.2–17.1)
Lymphs Abs: 1808 cells/uL (ref 850–3900)
MCH: 27.3 pg (ref 27.0–33.0)
MCHC: 33.7 g/dL (ref 32.0–36.0)
MCV: 81.1 fL (ref 80.0–100.0)
MPV: 11.5 fL (ref 7.5–12.5)
Monocytes Relative: 8.5 %
Neutro Abs: 2268 cells/uL (ref 1500–7800)
Neutrophils Relative %: 49.3 %
Platelets: 218 10*3/uL (ref 140–400)
RBC: 5.34 10*6/uL (ref 4.20–5.80)
RDW: 13.6 % (ref 11.0–15.0)
Total Lymphocyte: 39.3 %
WBC: 4.6 10*3/uL (ref 3.8–10.8)

## 2022-02-21 LAB — LIPID PANEL
Cholesterol: 230 mg/dL — ABNORMAL HIGH (ref <200)
HDL: 57 mg/dL (ref >=40)
LDL Cholesterol (Calc): 149 mg/dL (calc) — ABNORMAL HIGH
Non-HDL Cholesterol (Calc): 173 mg/dL (calc) — ABNORMAL HIGH (ref <130)
Total CHOL/HDL Ratio: 4 (calc) (ref <5.0)
Triglycerides: 121 mg/dL (ref <150)

## 2022-02-21 LAB — TSH: TSH: 1.6 mIU/L (ref 0.40–4.50)

## 2022-02-21 LAB — HEPATITIS C ANTIBODY: Hepatitis C Ab: NONREACTIVE

## 2022-02-21 LAB — HEMOGLOBIN A1C
Hgb A1c MFr Bld: 5.5 % of total Hgb (ref <5.7)
Mean Plasma Glucose: 111 mg/dL
eAG (mmol/L): 6.2 mmol/L

## 2022-02-21 LAB — HIV ANTIBODY (ROUTINE TESTING W REFLEX): HIV 1&2 Ab, 4th Generation: NONREACTIVE

## 2022-02-25 ENCOUNTER — Ambulatory Visit (INDEPENDENT_AMBULATORY_CARE_PROVIDER_SITE_OTHER): Payer: BC Managed Care – PPO | Admitting: Family

## 2022-02-25 ENCOUNTER — Encounter: Payer: Self-pay | Admitting: Family

## 2022-02-25 VITALS — BP 132/86 | HR 84 | Temp 96.3°F | Resp 18 | Ht 72.0 in | Wt 228.0 lb

## 2022-02-25 DIAGNOSIS — Z0000 Encounter for general adult medical examination without abnormal findings: Secondary | ICD-10-CM

## 2022-02-25 DIAGNOSIS — K219 Gastro-esophageal reflux disease without esophagitis: Secondary | ICD-10-CM | POA: Diagnosis not present

## 2022-02-25 DIAGNOSIS — I10 Essential (primary) hypertension: Secondary | ICD-10-CM

## 2022-02-25 DIAGNOSIS — E785 Hyperlipidemia, unspecified: Secondary | ICD-10-CM | POA: Diagnosis not present

## 2022-02-25 MED ORDER — AMLODIPINE BESYLATE 10 MG PO TABS
10.0000 mg | ORAL_TABLET | Freq: Every day | ORAL | 1 refills | Status: DC
Start: 2022-02-25 — End: 2022-08-27

## 2022-02-25 MED ORDER — DOXYCYCLINE HYCLATE 100 MG PO TABS: 100.0000 mg | ORAL_TABLET | Freq: Two times a day (BID) | ORAL | 0 refills | Status: AC

## 2022-02-25 NOTE — Progress Notes (Signed)
Provider: Richarda Blade FNP-C   Richanda Darin, Donalee Citrin, NP  Patient Care Team: Arsema Tusing, Donalee Citrin, NP as PCP - General (Family Medicine)  Extended Emergency Contact Information Primary Emergency Contact: SUBER,BENNET Mobile Phone: [phone] Relation: Significant other Secondary Emergency Contact: Roeder,CHRIS Address: [address]          [name]  [address] Home Phone: [phone] Relation: Father  Code Status:  Full Code  Goals of care: Advanced Directive information    02/25/2022    8:55 AM  Advanced Directives  Does Patient Have a Medical Advance Directive? No  Would patient like information on creating a medical advance directive? No - Patient declined     Chief Complaint  Patient presents with   Annual Exam    Patient is here for annual exam and fasting labs. Has bp log with him    HPI:  Pt is a 36 y.o. male seen today for Annual Physical examination.  Hypertension - brought his Blood pressure log to visit.Log reviewed and discussed with patient during visit readings ranging in the 100's/80's - 130's/90's and HR in the 70's -80's.  Reports increased stress level with a work coping well.  Hyperlipidemia - cholesterol 230,LDL 149 has changed his diet. Does exercise in the gym 30 minutes - 1 hr in the three times per week.   Alcohol use - 5-6 drinks on Friday -Saturday.  Alcohol use reduction discussed.  Had tick bite 2 weeks ago.Wife took it off.He was working on the boat and had a tree branch that was touching him.thinks might have gotten the tick from low branches.    Past Medical History:  Diagnosis Date   Allergy    Annual physical exam 08/11/2019   Anxiety    Elevated blood pressure reading without diagnosis of hypertension 08/11/2019   GERD (gastroesophageal reflux disease)    Hx of cholecystectomy 08/11/2019   Past Surgical History:  Procedure Laterality Date   HERNIA REPAIR     LAPAROSCOPIC CHOLECYSTECTOMY  01/2018   by Dr Magnus Ivan    No  Known Allergies  Allergies as of 02/25/2022   No Known Allergies      Medication List        Accurate as of February 25, 2022 11:59 PM. If you have any questions, ask your nurse or doctor.          amLODipine 10 MG tablet Commonly known as: NORVASC Take 1 tablet (10 mg total) by mouth daily.   cetirizine 10 MG tablet Commonly known as: ZYRTEC Take 10 mg by mouth daily as needed for allergies.   doxycycline 100 MG tablet Commonly known as: VIBRA-TABS Take 1 tablet (100 mg total) by mouth 2 (two) times daily for 10 days. Started by: Caesar Bookman, NP   esomeprazole 40 MG capsule Commonly known as: NEXIUM Take 1 capsule (40 mg total) by mouth daily at 12 noon.        Review of Systems  Constitutional:  Negative for appetite change, chills, fatigue, fever and unexpected weight change.  HENT:  Negative for congestion, dental problem, ear discharge, ear pain, facial swelling, hearing loss, nosebleeds, postnasal drip, rhinorrhea, sinus pressure, sinus pain, sneezing, sore throat, tinnitus and trouble swallowing.   Eyes:  Negative for pain, discharge, redness, itching and visual disturbance.  Respiratory:  Negative for cough, chest tightness, shortness of breath and wheezing.   Cardiovascular:  Negative for chest pain, palpitations and leg swelling.  Gastrointestinal:  Negative for abdominal distention, abdominal pain, blood in  stool, constipation, diarrhea, nausea and vomiting.  Endocrine: Negative for cold intolerance, heat intolerance, polydipsia, polyphagia and polyuria.  Genitourinary:  Negative for difficulty urinating, dysuria, flank pain, frequency and urgency.  Musculoskeletal:  Negative for arthralgias, back pain, gait problem, joint swelling, myalgias, neck pain and neck stiffness.  Skin:  Negative for color change, pallor, rash and wound.  Neurological:  Negative for dizziness, syncope, speech difficulty, weakness, light-headedness, numbness and headaches.   Hematological:  Does not bruise/bleed easily.  Psychiatric/Behavioral:  Negative for agitation, behavioral problems, confusion, hallucinations, self-injury, sleep disturbance and suicidal ideas. The patient is not nervous/anxious.     Immunization History  Administered Date(s) Administered   Influenza-Unspecified 04/11/2019   Tdap 08/11/2019   Pertinent  Health Maintenance Due  Topic Date Due   INFLUENZA VACCINE  04/02/2022      01/10/2020    5:14 PM 09/25/2020    4:40 PM 11/10/2020    9:38 AM 01/01/2022   10:00 AM 02/25/2022   10:21 AM  Fall Risk  Falls in the past year? 0 0 0 0 0  Was there an injury with Fall? 0 0 0 0 0  Fall Risk Category Calculator 0 0 0  0  Fall Risk Category Low Low Low  Low  Patient Fall Risk Level Low fall risk   Low fall risk Low fall risk  Patient at Risk for Falls Due to    No Fall Risks No Fall Risks  Fall risk Follow up Falls evaluation completed Falls evaluation completed Falls evaluation completed Falls evaluation completed Falls evaluation completed   Functional Status Survey:    Vitals:   02/25/22 1018  BP: 132/86  Pulse: 84  Resp: 18  Temp: (!) 96.3 F (35.7 C)  SpO2: 98%  Weight: 228 lb (103.4 kg)  Height: 6' (1.829 m)  HC: 72" (182.9 cm)   Body mass index is 30.92 kg/m. Physical Exam Vitals reviewed.  Constitutional:      General: He is not in acute distress.    Appearance: Normal appearance. He is normal weight. He is not ill-appearing or diaphoretic.  HENT:     Head: Normocephalic.     Right Ear: Tympanic membrane, ear canal and external ear normal. There is no impacted cerumen.     Left Ear: Tympanic membrane, ear canal and external ear normal. There is no impacted cerumen.     Nose: Nose normal. No congestion or rhinorrhea.     Mouth/Throat:     Mouth: Mucous membranes are moist.     Pharynx: Oropharynx is clear. No oropharyngeal exudate or posterior oropharyngeal erythema.  Eyes:     General: No scleral icterus.        Right eye: No discharge.        Left eye: No discharge.     Extraocular Movements: Extraocular movements intact.     Conjunctiva/sclera: Conjunctivae normal.     Pupils: Pupils are equal, round, and reactive to light.  Neck:     Vascular: No carotid bruit.  Cardiovascular:     Rate and Rhythm: Normal rate and regular rhythm.     Pulses: Normal pulses.     Heart sounds: Normal heart sounds. No murmur heard.    No friction rub. No gallop.  Pulmonary:     Effort: Pulmonary effort is normal. No respiratory distress.     Breath sounds: Normal breath sounds. No wheezing, rhonchi or rales.  Chest:     Chest wall: No tenderness.  Abdominal:  General: Bowel sounds are normal. There is no distension.     Palpations: Abdomen is soft. There is no mass.     Tenderness: There is no abdominal tenderness. There is no right CVA tenderness, left CVA tenderness, guarding or rebound.  Musculoskeletal:        General: No swelling or tenderness. Normal range of motion.     Cervical back: Normal range of motion. No rigidity or tenderness.     Right lower leg: No edema.     Left lower leg: No edema.  Lymphadenopathy:     Cervical: No cervical adenopathy.  Skin:    General: Skin is warm and dry.     Coloration: Skin is not pale.     Findings: No bruising, lesion or rash.     Comments: Right lower back 2 cm erythema area with mid whitish spot noted.Non-tender to palpation and without any drainage.   Neurological:     Mental Status: He is alert and oriented to person, place, and time.     Cranial Nerves: No cranial nerve deficit.     Sensory: No sensory deficit.     Motor: No weakness.     Coordination: Coordination normal.     Gait: Gait normal.  Psychiatric:        Mood and Affect: Mood normal.        Speech: Speech normal.        Behavior: Behavior normal.        Thought Content: Thought content normal.        Judgment: Judgment normal.     Labs reviewed: Recent Labs    02/20/22 1107   NA 139  K 4.4  CL 102  CO2 27  GLUCOSE 89  BUN 11  CREATININE 0.94  CALCIUM 9.8   Recent Labs    02/20/22 1107  AST 22  ALT 27  BILITOT 0.7  PROT 8.1   Recent Labs    02/20/22 1107  WBC 4.6  NEUTROABS 2,268  HGB 14.6  HCT 43.3  MCV 81.1  PLT 218   Lab Results  Component Value Date   TSH 1.60 02/20/2022   Lab Results  Component Value Date   HGBA1C 5.5 02/20/2022   Lab Results  Component Value Date   CHOL 230 (H) 02/20/2022   HDL 57 02/20/2022   LDLCALC 149 (H) 02/20/2022   TRIG 121 02/20/2022   CHOLHDL 4.0 02/20/2022    Significant Diagnostic Results in last 30 days:  No results found.  Assessment/Plan 1. Essential hypertension, benign Blood pressure has improved -Continue on amlodipine -Advised to continue with dietary modification and exercise - amLODipine (NORVASC) 10 MG tablet; Take 1 tablet (10 mg total) by mouth daily.  Dispense: 90 tablet; Refill: 1  2. Hyperlipidemia LDL goal <100 LDL not at goal -Recommend a low saturated fats, low carbohydrates and high vegetable diet also exercise at least 3 times per week for 30 minutes. - Lipid Panel; Future  3. Gastroesophageal reflux disease, unspecified whether esophagitis present Symptoms controlled. H/H stable.No tarry or black stool  - advised to avoid eating meals late in the evening and to avoid aggravating foods and spices. - continue on esomeprazole  4. Annual physical exam Up to date with immunization except COVID-19 vaccine.  Aware to get vaccine at the pharmacy. Medication and labs reviewed patient counselled regarding yearly exam, prevention of dental and periodontal disease, diet, regular sustained exercise for at least 30 minutes x 3 /week. Proper use of sun  screen and protective clothing, recommended schedule for Annual routine labs.  Family/ staff Communication: Reviewed plan of care with patient verbalized understanding  Labs/tests ordered: None   Next Appointment : Return in  about 4 months (around 06/27/2022) for medical mangement of chronic issues.Fasting lipid panel in 4 months.Caesar Bookman, NP

## 2022-06-13 ENCOUNTER — Other Ambulatory Visit: Payer: Self-pay

## 2022-06-13 DIAGNOSIS — E785 Hyperlipidemia, unspecified: Secondary | ICD-10-CM

## 2022-06-21 ENCOUNTER — Other Ambulatory Visit: Payer: BC Managed Care – PPO

## 2022-06-27 ENCOUNTER — Ambulatory Visit: Payer: BC Managed Care – PPO | Admitting: Family

## 2022-07-15 ENCOUNTER — Other Ambulatory Visit: Payer: BC Managed Care – PPO

## 2022-07-15 ENCOUNTER — Other Ambulatory Visit: Payer: Self-pay | Admitting: Family

## 2022-07-15 DIAGNOSIS — E785 Hyperlipidemia, unspecified: Secondary | ICD-10-CM | POA: Diagnosis not present

## 2022-07-15 LAB — LIPID PANEL
Cholesterol: 247 mg/dL — ABNORMAL HIGH (ref <200)
HDL: 57 mg/dL (ref >=40)
LDL Cholesterol (Calc): 159 mg/dL (calc) — ABNORMAL HIGH
Non-HDL Cholesterol (Calc): 190 mg/dL (calc) — ABNORMAL HIGH (ref <130)
Total CHOL/HDL Ratio: 4.3 (calc) (ref <5.0)
Triglycerides: 160 mg/dL — ABNORMAL HIGH (ref <150)

## 2022-07-19 ENCOUNTER — Ambulatory Visit: Payer: BC Managed Care – PPO | Admitting: Family

## 2022-07-19 ENCOUNTER — Encounter: Payer: Self-pay | Admitting: Family

## 2022-07-19 VITALS — BP 132/80 | HR 76 | Temp 97.5°F | Resp 17 | Ht 72.0 in | Wt 242.8 lb

## 2022-07-19 DIAGNOSIS — E785 Hyperlipidemia, unspecified: Secondary | ICD-10-CM

## 2022-07-19 DIAGNOSIS — I10 Essential (primary) hypertension: Secondary | ICD-10-CM

## 2022-07-19 DIAGNOSIS — F411 Generalized anxiety disorder: Secondary | ICD-10-CM | POA: Diagnosis not present

## 2022-07-19 MED ORDER — BUSPIRONE HCL 5 MG PO TABS: 5.0000 mg | ORAL_TABLET | Freq: Two times a day (BID) | ORAL | 3 refills | Status: DC

## 2022-07-19 NOTE — Progress Notes (Signed)
Provider: Richarda Blade FNP-C   Titianna Loomis, Donalee Citrin, NP  Patient Care Team: Yashua Bracco, Donalee Citrin, NP as PCP - General (Family Medicine)  Extended Emergency Contact Information Primary Emergency Contact: SUBER,BENNET Mobile Phone: [phone] Relation: Significant other Secondary Emergency Contact: Presutti,CHRIS Address: [address]          [name]  [address] Home Phone: [phone] Relation: Father  Code Status:  Full Code  Goals of care: Advanced Directive information    07/19/2022    2:57 PM  Advanced Directives  Does Patient Have a Medical Advance Directive? No  Would patient like information on creating a medical advance directive? No - Patient declined     Chief Complaint  Patient presents with  . Medical Management of Chronic Issues    4 month follow up.  . Immunizations    Discuss the need for Covid vaccine, and Influenza vaccine.    HPI:  Pt is a 36 y.o. male seen today for 4 month follow up for medical management of chronic diseases.  Recent lipid panel results reviewed and discussed during visit.Labs higher than previous level. Total cholesterol 247; TRG 160 and LDL 159  States has not been sticking to his diet. Recently started going to the [employer] to exercise.has realized morning works better for him so he will be waking up at 5 am to go to [facility]. States has a family hx of heart problems grandfather had heart attack and the father has HTN.  He denies any headache,dizziness,vision changes,fatigue,chest tightness,palpitation,chest pain or shortness of breath.        Past Medical History:  Diagnosis Date  . Allergy   . Annual physical exam 08/11/2019  . Anxiety   . Elevated blood pressure reading without diagnosis of hypertension 08/11/2019  . GERD (gastroesophageal reflux disease)   . Hx of cholecystectomy 08/11/2019   Past Surgical History:  Procedure Laterality Date  . HERNIA REPAIR    . LAPAROSCOPIC CHOLECYSTECTOMY  01/2018   by Dr Magnus Ivan     No Known Allergies  Allergies as of 07/19/2022   No Known Allergies      Medication List        Accurate as of July 19, 2022  3:17 PM. If you have any questions, ask your nurse or doctor.          amLODipine 10 MG tablet Commonly known as: NORVASC Take 1 tablet (10 mg total) by mouth daily.   cetirizine 10 MG tablet Commonly known as: ZYRTEC Take 10 mg by mouth daily as needed for allergies.   esomeprazole 40 MG capsule Commonly known as: NEXIUM Take 1 capsule (40 mg total) by mouth daily at 12 noon.        Review of Systems  Constitutional:  Negative for appetite change, chills, fatigue, fever and unexpected weight change.  HENT:  Negative for congestion, dental problem, ear discharge, ear pain, facial swelling, hearing loss, nosebleeds, postnasal drip, rhinorrhea, sinus pressure, sinus pain, sneezing, sore throat, tinnitus and trouble swallowing.   Eyes:  Negative for pain, discharge, redness, itching and visual disturbance.  Respiratory:  Negative for cough, chest tightness, shortness of breath and wheezing.   Cardiovascular:  Negative for chest pain, palpitations and leg swelling.  Gastrointestinal:  Negative for abdominal distention, abdominal pain, blood in stool, constipation, diarrhea, nausea and vomiting.  Endocrine: Negative for cold intolerance, heat intolerance, polydipsia, polyphagia and polyuria.  Genitourinary:  Negative for difficulty urinating, dysuria, flank pain, frequency and urgency.  Musculoskeletal:  Negative for arthralgias,  back pain, gait problem, joint swelling, myalgias, neck pain and neck stiffness.  Skin:  Negative for color change, pallor, rash and wound.  Neurological:  Negative for dizziness, syncope, speech difficulty, weakness, light-headedness, numbness and headaches.  Hematological:  Does not bruise/bleed easily.  Psychiatric/Behavioral:  Negative for agitation, behavioral problems, confusion, hallucinations, self-injury,  sleep disturbance and suicidal ideas. The patient is nervous/anxious.     Immunization History  Administered Date(s) Administered  . Influenza-Unspecified 04/11/2019  . Tdap 08/11/2019   Pertinent  Health Maintenance Due  Topic Date Due  . INFLUENZA VACCINE  04/02/2022      09/25/2020    4:40 PM 11/10/2020    9:38 AM 01/01/2022   10:00 AM 02/25/2022   10:21 AM 07/19/2022    2:56 PM  Fall Risk  Falls in the past year? 0 0 0 0 0  Was there an injury with Fall? 0 0 0 0 0  Fall Risk Category Calculator 0 0  0 0  Fall Risk Category Low Low  Low Low  Patient Fall Risk Level   Low fall risk Low fall risk Low fall risk  Patient at Risk for Falls Due to   No Fall Risks No Fall Risks No Fall Risks  Fall risk Follow up Falls evaluation completed Falls evaluation completed Falls evaluation completed Falls evaluation completed Falls evaluation completed   Functional Status Survey:    Vitals:   07/19/22 1455  BP: 132/80  Pulse: 76  Resp: 17  Temp: (!) 97.5 F (36.4 C)  SpO2: 97%  Weight: 242 lb 12.8 oz (110.1 kg)  Height: 6' (1.829 m)   Body mass index is 32.93 kg/m. Physical Exam Vitals reviewed.  Constitutional:      General: He is not in acute distress.    Appearance: Normal appearance. He is obese. He is not ill-appearing or diaphoretic.  HENT:     Head: Normocephalic.     Right Ear: Tympanic membrane, ear canal and external ear normal. There is no impacted cerumen.     Left Ear: Tympanic membrane, ear canal and external ear normal. There is no impacted cerumen.     Nose: Nose normal. No congestion or rhinorrhea.     Mouth/Throat:     Mouth: Mucous membranes are moist.     Pharynx: Oropharynx is clear. No oropharyngeal exudate or posterior oropharyngeal erythema.  Eyes:     General: No scleral icterus.       Right eye: No discharge.        Left eye: No discharge.     Extraocular Movements: Extraocular movements intact.     Conjunctiva/sclera: Conjunctivae normal.      Pupils: Pupils are equal, round, and reactive to light.  Neck:     Vascular: No carotid bruit.  Cardiovascular:     Rate and Rhythm: Normal rate and regular rhythm.     Pulses: Normal pulses.     Heart sounds: Normal heart sounds. No murmur heard.    No friction rub. No gallop.  Pulmonary:     Effort: Pulmonary effort is normal. No respiratory distress.     Breath sounds: Normal breath sounds. No wheezing, rhonchi or rales.  Chest:     Chest wall: No tenderness.  Abdominal:     General: Bowel sounds are normal. There is no distension.     Palpations: Abdomen is soft. There is no mass.     Tenderness: There is no abdominal tenderness. There is no right CVA tenderness, left CVA  tenderness, guarding or rebound.  Musculoskeletal:        General: No swelling or tenderness. Normal range of motion.     Cervical back: Normal range of motion. No rigidity or tenderness.     Right lower leg: No edema.     Left lower leg: No edema.  Lymphadenopathy:     Cervical: No cervical adenopathy.  Skin:    General: Skin is warm and dry.     Coloration: Skin is not pale.     Findings: No bruising, erythema, lesion or rash.  Neurological:     Mental Status: He is alert and oriented to person, place, and time.     Cranial Nerves: No cranial nerve deficit.     Sensory: No sensory deficit.     Motor: No weakness.     Coordination: Coordination normal.     Gait: Gait normal.  Psychiatric:        Mood and Affect: Mood is anxious.        Speech: Speech normal.        Behavior: Behavior normal.        Thought Content: Thought content normal.        Judgment: Judgment normal.    Labs reviewed: Recent Labs    02/20/22 1107  NA 139  K 4.4  CL 102  CO2 27  GLUCOSE 89  BUN 11  CREATININE 0.94  CALCIUM 9.8   Recent Labs    02/20/22 1107  AST 22  ALT 27  BILITOT 0.7  PROT 8.1   Recent Labs    02/20/22 1107  WBC 4.6  NEUTROABS 2,268  HGB 14.6  HCT 43.3  MCV 81.1  PLT 218   Lab  Results  Component Value Date   TSH 1.60 02/20/2022   Lab Results  Component Value Date   HGBA1C 5.5 02/20/2022   Lab Results  Component Value Date   CHOL 247 (H) 07/15/2022   HDL 57 07/15/2022   LDLCALC 159 (H) 07/15/2022   TRIG 160 (H) 07/15/2022   CHOLHDL 4.3 07/15/2022    Significant Diagnostic Results in last 30 days:  No results found.  Assessment/Plan There are no diagnoses linked to this encounter.   Family/ staff Communication: Reviewed plan of care with patient  Labs/tests ordered: None   Next Appointment :   Caesar Bookman, NP

## 2022-07-19 NOTE — Patient Instructions (Signed)

## 2022-08-13 ENCOUNTER — Other Ambulatory Visit: Payer: Self-pay | Admitting: Family

## 2022-08-13 DIAGNOSIS — F411 Generalized anxiety disorder: Secondary | ICD-10-CM

## 2022-08-25 ENCOUNTER — Other Ambulatory Visit: Payer: Self-pay | Admitting: Family

## 2022-08-25 DIAGNOSIS — I10 Essential (primary) hypertension: Secondary | ICD-10-CM

## 2022-12-17 ENCOUNTER — Other Ambulatory Visit: Payer: BC Managed Care – PPO

## 2022-12-17 DIAGNOSIS — I10 Essential (primary) hypertension: Secondary | ICD-10-CM

## 2022-12-17 DIAGNOSIS — E785 Hyperlipidemia, unspecified: Secondary | ICD-10-CM

## 2022-12-17 DIAGNOSIS — R7309 Other abnormal glucose: Secondary | ICD-10-CM | POA: Diagnosis not present

## 2022-12-18 LAB — COMPLETE METABOLIC PANEL WITH GFR
Total Bilirubin: 0.8 mg/dL (ref 0.2–1.2)
Total Protein: 7.3 g/dL (ref 6.1–8.1)

## 2022-12-18 LAB — LIPID PANEL: Triglycerides: 257 mg/dL — ABNORMAL HIGH (ref <150)

## 2022-12-20 ENCOUNTER — Encounter: Payer: Self-pay | Admitting: Family

## 2022-12-20 ENCOUNTER — Ambulatory Visit: Payer: BC Managed Care – PPO | Admitting: Family

## 2022-12-20 VITALS — BP 128/88 | HR 82 | Temp 98.0°F | Resp 16 | Ht 72.0 in | Wt 232.8 lb

## 2022-12-20 DIAGNOSIS — I10 Essential (primary) hypertension: Secondary | ICD-10-CM | POA: Diagnosis not present

## 2022-12-20 DIAGNOSIS — K219 Gastro-esophageal reflux disease without esophagitis: Secondary | ICD-10-CM | POA: Diagnosis not present

## 2022-12-20 DIAGNOSIS — E785 Hyperlipidemia, unspecified: Secondary | ICD-10-CM

## 2022-12-20 DIAGNOSIS — R7303 Prediabetes: Secondary | ICD-10-CM

## 2022-12-20 DIAGNOSIS — F411 Generalized anxiety disorder: Secondary | ICD-10-CM

## 2022-12-20 NOTE — Progress Notes (Unsigned)
Provider: Richarda Blade FNP-C   Zariah Jost, Donalee Citrin, NP  Patient Care Team: Kellan Boehlke, Donalee Citrin, NP as PCP - General (Family Medicine)  Extended Emergency Contact Information Primary Emergency Contact: SUBER,BENNET Mobile Phone: [phone] Relation: Significant other Secondary Emergency Contact: Ardila,CHRIS Address: [address]          [name]  [address] Home Phone: [phone] Relation: Father  Code Status:  Full Code  Goals of care: Advanced Directive information    07/19/2022    2:57 PM  Advanced Directives  Does Patient Have a Medical Advance Directive? No  Would patient like information on creating a medical advance directive? No - Patient declined     Chief Complaint  Patient presents with   Medical Management of Chronic Issues    HPI:  Pt is a 37 y.o. male seen today for medical management of chronic diseases.     Past Medical History:  Diagnosis Date   Allergy    Annual physical exam 08/11/2019   Anxiety    Elevated blood pressure reading without diagnosis of hypertension 08/11/2019   GERD (gastroesophageal reflux disease)    Hx of cholecystectomy 08/11/2019   Past Surgical History:  Procedure Laterality Date   HERNIA REPAIR     LAPAROSCOPIC CHOLECYSTECTOMY  01/2018   by Dr Magnus Ivan    No Known Allergies  Allergies as of 12/20/2022   No Known Allergies      Medication List        Accurate as of December 20, 2022  3:34 PM. If you have any questions, ask your nurse or doctor.          amLODipine 10 MG tablet Commonly known as: NORVASC TAKE 1 TABLET BY MOUTH EVERY DAY   busPIRone 5 MG tablet Commonly known as: BUSPAR TAKE 1 TABLET BY MOUTH TWICE A DAY   cetirizine 10 MG tablet Commonly known as: ZYRTEC Take 10 mg by mouth daily as needed for allergies.   esomeprazole 40 MG capsule Commonly known as: NEXIUM Take 1 capsule (40 mg total) by mouth daily at 12 noon.        Review of Systems  Constitutional:  Negative for  appetite change, chills, fatigue, fever and unexpected weight change.  HENT:  Negative for congestion, dental problem, ear discharge, ear pain, facial swelling, hearing loss, nosebleeds, postnasal drip, rhinorrhea, sinus pressure, sinus pain, sneezing, sore throat, tinnitus and trouble swallowing.   Eyes:  Negative for pain, discharge, redness, itching and visual disturbance.  Respiratory:  Negative for cough, chest tightness, shortness of breath and wheezing.   Cardiovascular:  Negative for chest pain, palpitations and leg swelling.  Gastrointestinal:  Negative for abdominal distention, abdominal pain, blood in stool, constipation, diarrhea, nausea and vomiting.  Endocrine: Negative for cold intolerance, heat intolerance, polydipsia, polyphagia and polyuria.  Genitourinary:  Negative for difficulty urinating, dysuria, flank pain, frequency and urgency.  Musculoskeletal:  Negative for arthralgias, back pain, gait problem, joint swelling, myalgias, neck pain and neck stiffness.  Skin:  Negative for color change, pallor, rash and wound.  Neurological:  Negative for dizziness, syncope, speech difficulty, weakness, light-headedness, numbness and headaches.  Hematological:  Does not bruise/bleed easily.  Psychiatric/Behavioral:  Negative for agitation, behavioral problems, confusion, hallucinations, self-injury, sleep disturbance and suicidal ideas. The patient is not nervous/anxious.     Immunization History  Administered Date(s) Administered   Influenza-Unspecified 04/11/2019   Tdap 08/11/2019   Pertinent  Health Maintenance Due  Topic Date Due   INFLUENZA VACCINE  04/03/2023  11/10/2020    9:38 AM 01/01/2022   10:00 AM 02/25/2022   10:21 AM 07/19/2022    2:56 PM 12/20/2022    3:16 PM  Fall Risk  Falls in the past year? 0 0 0 0 0  Was there an injury with Fall? 0 0 0 0 0  Fall Risk Category Calculator 0  0 0 0  Fall Risk Category (Retired) Low  Low Low   (RETIRED) Patient Fall Risk  Level  Low fall risk Low fall risk Low fall risk   Patient at Risk for Falls Due to  No Fall Risks No Fall Risks No Fall Risks No Fall Risks  Fall risk Follow up Falls evaluation completed Falls evaluation completed Falls evaluation completed Falls evaluation completed    Functional Status Survey:    Vitals:   12/20/22 1516  BP: 128/88  Pulse: 82  Resp: 16  Temp: 98 F (36.7 C)  TempSrc: Temporal  SpO2: 97%  Weight: 232 lb 12.8 oz (105.6 kg)  Height: 6' (1.829 m)   Body mass index is 31.57 kg/m. Physical Exam Vitals reviewed.  Constitutional:      General: He is not in acute distress.    Appearance: Normal appearance. He is obese. He is not ill-appearing or diaphoretic.  HENT:     Head: Normocephalic.     Right Ear: Tympanic membrane, ear canal and external ear normal. There is no impacted cerumen.     Left Ear: Tympanic membrane, ear canal and external ear normal. There is no impacted cerumen.     Nose: Nose normal. No congestion or rhinorrhea.     Mouth/Throat:     Mouth: Mucous membranes are moist.     Pharynx: Oropharynx is clear. No oropharyngeal exudate or posterior oropharyngeal erythema.  Eyes:     General: No scleral icterus.       Right eye: No discharge.        Left eye: No discharge.     Extraocular Movements: Extraocular movements intact.     Conjunctiva/sclera: Conjunctivae normal.     Pupils: Pupils are equal, round, and reactive to light.  Neck:     Vascular: No carotid bruit.  Cardiovascular:     Rate and Rhythm: Normal rate and regular rhythm.     Pulses: Normal pulses.     Heart sounds: Normal heart sounds. No murmur heard.    No friction rub. No gallop.  Pulmonary:     Effort: Pulmonary effort is normal. No respiratory distress.     Breath sounds: Normal breath sounds. No wheezing, rhonchi or rales.  Chest:     Chest wall: No tenderness.  Abdominal:     General: Bowel sounds are normal. There is no distension.     Palpations: Abdomen is soft.  There is no mass.     Tenderness: There is no abdominal tenderness. There is no right CVA tenderness, left CVA tenderness, guarding or rebound.  Musculoskeletal:        General: No swelling or tenderness. Normal range of motion.     Cervical back: Normal range of motion. No rigidity or tenderness.     Right lower leg: No edema.     Left lower leg: No edema.  Lymphadenopathy:     Cervical: No cervical adenopathy.  Skin:    General: Skin is warm and dry.     Coloration: Skin is not pale.     Findings: No bruising, erythema, lesion or rash.  Neurological:  Mental Status: He is alert and oriented to person, place, and time.     Cranial Nerves: No cranial nerve deficit.     Sensory: No sensory deficit.     Motor: No weakness.     Coordination: Coordination normal.     Gait: Gait normal.  Psychiatric:        Mood and Affect: Mood normal.        Speech: Speech normal.        Behavior: Behavior normal.        Thought Content: Thought content normal.        Judgment: Judgment normal.     Labs reviewed: Recent Labs    02/20/22 1107 12/17/22 0804  NA 139 137  K 4.4 4.1  CL 102 102  CO2 27 27  GLUCOSE 89 109*  BUN 11 11  CREATININE 0.94 0.95  CALCIUM 9.8 9.3   Recent Labs    02/20/22 1107 12/17/22 0804  AST 22 19  ALT 27 24  BILITOT 0.7 0.8  PROT 8.1 7.3   Recent Labs    02/20/22 1107 12/17/22 0804  WBC 4.6 4.6  NEUTROABS 2,268 2,374  HGB 14.6 14.5  HCT 43.3 44.4  MCV 81.1 82.2  PLT 218 211   Lab Results  Component Value Date   TSH 2.05 12/17/2022   Lab Results  Component Value Date   HGBA1C 5.5 02/20/2022   Lab Results  Component Value Date   CHOL 234 (H) 12/17/2022   HDL 44 12/17/2022   LDLCALC 148 (H) 12/17/2022   TRIG 257 (H) 12/17/2022   CHOLHDL 5.3 (H) 12/17/2022    Significant Diagnostic Results in last 30 days:  No results found.  Assessment/Plan There are no diagnoses linked to this encounter.   Family/ staff Communication:  Reviewed plan of care with patient  Labs/tests ordered: None   Next Appointment :   Caesar Bookman, NP

## 2022-12-21 LAB — TEST AUTHORIZATION

## 2022-12-21 LAB — COMPLETE METABOLIC PANEL WITH GFR
ALT: 24 U/L (ref 9–46)
Albumin: 4.8 g/dL (ref 3.6–5.1)
BUN: 11 mg/dL (ref 7–25)
CO2: 27 mmol/L (ref 20–32)
Chloride: 102 mmol/L (ref 98–110)
Glucose, Bld: 109 mg/dL — ABNORMAL HIGH (ref 65–99)
Potassium: 4.1 mmol/L (ref 3.5–5.3)
eGFR: 106 mL/min/{1.73_m2} (ref >=60)

## 2022-12-21 LAB — LIPID PANEL
LDL Cholesterol (Calc): 148 mg/dL (calc) — ABNORMAL HIGH
Total CHOL/HDL Ratio: 5.3 (calc) — ABNORMAL HIGH (ref <5.0)

## 2022-12-23 ENCOUNTER — Other Ambulatory Visit: Payer: Self-pay | Admitting: Family

## 2022-12-23 DIAGNOSIS — K219 Gastro-esophageal reflux disease without esophagitis: Secondary | ICD-10-CM

## 2022-12-23 NOTE — Telephone Encounter (Signed)
Patient medication has High Risk Warnings. Medication pend and sent to PCP Ngetich, Donalee Citrin, NP for approval.

## 2022-12-24 LAB — COMPLETE METABOLIC PANEL WITH GFR
AG Ratio: 1.9 (calc) (ref 1.0–2.5)
AST: 19 U/L (ref 10–40)
Alkaline phosphatase (APISO): 61 U/L (ref 36–130)
Calcium: 9.3 mg/dL (ref 8.6–10.3)
Creat: 0.95 mg/dL (ref 0.60–1.26)
Globulin: 2.5 g/dL (calc) (ref 1.9–3.7)
Sodium: 137 mmol/L (ref 135–146)

## 2022-12-24 LAB — LIPID PANEL
Cholesterol: 234 mg/dL — ABNORMAL HIGH (ref <200)
HDL: 44 mg/dL (ref >=40)
Non-HDL Cholesterol (Calc): 190 mg/dL (calc) — ABNORMAL HIGH (ref <130)

## 2022-12-24 LAB — CBC WITH DIFFERENTIAL/PLATELET
Absolute Monocytes: 414 cells/uL (ref 200–950)
Basophils Absolute: 41 cells/uL (ref 0–200)
Basophils Relative: 0.9 %
Eosinophils Absolute: 161 cells/uL (ref 15–500)
Eosinophils Relative: 3.5 %
HCT: 44.4 % (ref 38.5–50.0)
Hemoglobin: 14.5 g/dL (ref 13.2–17.1)
Lymphs Abs: 1610 cells/uL (ref 850–3900)
MCH: 26.9 pg — ABNORMAL LOW (ref 27.0–33.0)
MCHC: 32.7 g/dL (ref 32.0–36.0)
MCV: 82.2 fL (ref 80.0–100.0)
MPV: 11.1 fL (ref 7.5–12.5)
Monocytes Relative: 9 %
Neutro Abs: 2374 cells/uL (ref 1500–7800)
Neutrophils Relative %: 51.6 %
Platelets: 211 10*3/uL (ref 140–400)
RBC: 5.4 10*6/uL (ref 4.20–5.80)
RDW: 13.9 % (ref 11.0–15.0)
Total Lymphocyte: 35 %
WBC: 4.6 10*3/uL (ref 3.8–10.8)

## 2022-12-24 LAB — TEST AUTHORIZATION

## 2022-12-24 LAB — TSH: TSH: 2.05 mIU/L (ref 0.40–4.50)

## 2022-12-24 LAB — HEMOGLOBIN A1C
Hgb A1c MFr Bld: 5.8 % of total Hgb — ABNORMAL HIGH (ref <5.7)
Mean Plasma Glucose: 120 mg/dL
eAG (mmol/L): 6.6 mmol/L

## 2023-03-08 ENCOUNTER — Other Ambulatory Visit: Payer: Self-pay | Admitting: Family

## 2023-03-08 DIAGNOSIS — I10 Essential (primary) hypertension: Secondary | ICD-10-CM

## 2023-04-21 ENCOUNTER — Other Ambulatory Visit: Payer: BC Managed Care – PPO

## 2023-04-25 ENCOUNTER — Ambulatory Visit: Payer: BC Managed Care – PPO | Admitting: Family

## 2023-06-02 ENCOUNTER — Other Ambulatory Visit: Payer: BC Managed Care – PPO

## 2023-06-02 DIAGNOSIS — I10 Essential (primary) hypertension: Secondary | ICD-10-CM | POA: Diagnosis not present

## 2023-06-02 DIAGNOSIS — R7303 Prediabetes: Secondary | ICD-10-CM | POA: Diagnosis not present

## 2023-06-02 DIAGNOSIS — E785 Hyperlipidemia, unspecified: Secondary | ICD-10-CM | POA: Diagnosis not present

## 2023-06-03 LAB — LIPID PANEL
Cholesterol: 192 mg/dL (ref <200)
HDL: 41 mg/dL (ref >=40)
LDL Cholesterol (Calc): 119 mg/dL — ABNORMAL HIGH
Non-HDL Cholesterol (Calc): 151 mg/dL — ABNORMAL HIGH (ref <130)
Total CHOL/HDL Ratio: 4.7 (calc) (ref <5.0)
Triglycerides: 197 mg/dL — ABNORMAL HIGH (ref <150)

## 2023-06-03 LAB — COMPLETE METABOLIC PANEL WITH GFR
AG Ratio: 1.6 (calc) (ref 1.0–2.5)
ALT: 21 U/L (ref 9–46)
AST: 17 U/L (ref 10–40)
Albumin: 4.4 g/dL (ref 3.6–5.1)
Alkaline phosphatase (APISO): 69 U/L (ref 36–130)
BUN: 14 mg/dL (ref 7–25)
CO2: 27 mmol/L (ref 20–32)
Calcium: 9.1 mg/dL (ref 8.6–10.3)
Chloride: 101 mmol/L (ref 98–110)
Creat: 0.9 mg/dL (ref 0.60–1.26)
Globulin: 2.7 g/dL (ref 1.9–3.7)
Glucose, Bld: 100 mg/dL — ABNORMAL HIGH (ref 65–99)
Potassium: 4.2 mmol/L (ref 3.5–5.3)
Sodium: 137 mmol/L (ref 135–146)
Total Bilirubin: 0.5 mg/dL (ref 0.2–1.2)
Total Protein: 7.1 g/dL (ref 6.1–8.1)
eGFR: 114 mL/min/{1.73_m2} (ref >=60)

## 2023-06-03 LAB — CBC WITH DIFFERENTIAL/PLATELET
Absolute Monocytes: 508 {cells}/uL (ref 200–950)
Basophils Absolute: 29 {cells}/uL (ref 0–200)
Basophils Relative: 0.7 %
Eosinophils Absolute: 109 {cells}/uL (ref 15–500)
Eosinophils Relative: 2.6 %
HCT: 43.3 % (ref 38.5–50.0)
Hemoglobin: 14.1 g/dL (ref 13.2–17.1)
Lymphs Abs: 1550 {cells}/uL (ref 850–3900)
MCH: 27.7 pg (ref 27.0–33.0)
MCHC: 32.6 g/dL (ref 32.0–36.0)
MCV: 85.1 fL (ref 80.0–100.0)
MPV: 12 fL (ref 7.5–12.5)
Monocytes Relative: 12.1 %
Neutro Abs: 2003 {cells}/uL (ref 1500–7800)
Neutrophils Relative %: 47.7 %
Platelets: 215 10*3/uL (ref 140–400)
RBC: 5.09 10*6/uL (ref 4.20–5.80)
RDW: 13.3 % (ref 11.0–15.0)
Total Lymphocyte: 36.9 %
WBC: 4.2 10*3/uL (ref 3.8–10.8)

## 2023-06-03 LAB — HEMOGLOBIN A1C
Hgb A1c MFr Bld: 5.7 %{Hb} — ABNORMAL HIGH (ref <5.7)
Mean Plasma Glucose: 117 mg/dL
eAG (mmol/L): 6.5 mmol/L

## 2023-06-06 ENCOUNTER — Ambulatory Visit: Payer: BC Managed Care – PPO | Admitting: Adult Health

## 2023-06-06 ENCOUNTER — Encounter: Payer: Self-pay | Admitting: Adult Health

## 2023-06-06 VITALS — BP 142/90 | HR 84 | Temp 97.9°F | Resp 18 | Ht 72.0 in | Wt 228.2 lb

## 2023-06-06 DIAGNOSIS — I10 Essential (primary) hypertension: Secondary | ICD-10-CM | POA: Diagnosis not present

## 2023-06-06 DIAGNOSIS — E782 Mixed hyperlipidemia: Secondary | ICD-10-CM | POA: Diagnosis not present

## 2023-06-06 DIAGNOSIS — K219 Gastro-esophageal reflux disease without esophagitis: Secondary | ICD-10-CM | POA: Diagnosis not present

## 2023-06-06 DIAGNOSIS — G44219 Episodic tension-type headache, not intractable: Secondary | ICD-10-CM

## 2023-06-06 DIAGNOSIS — R7303 Prediabetes: Secondary | ICD-10-CM

## 2023-06-06 DIAGNOSIS — F411 Generalized anxiety disorder: Secondary | ICD-10-CM

## 2023-06-06 DIAGNOSIS — Z2821 Immunization not carried out because of patient refusal: Secondary | ICD-10-CM

## 2023-06-06 MED ORDER — AMLODIPINE BESYLATE 10 MG PO TABS
10.0000 mg | ORAL_TABLET | Freq: Every day | ORAL | 1 refills | Status: DC
  Filled 2023-10-20: qty 60, 60d supply, fill #0

## 2023-06-06 MED ORDER — BUSPIRONE HCL 5 MG PO TABS
5.0000 mg | ORAL_TABLET | Freq: Two times a day (BID) | ORAL | 1 refills | Status: DC
Start: 2023-06-06 — End: 2023-11-14

## 2023-06-06 NOTE — Progress Notes (Unsigned)
[facility]  Provider:   Code Status:   Full Code  Goals of Care:     06/06/2023    3:23 PM  Advanced Directives  Does Patient Have a Medical Advance Directive? No     Chief Complaint  Patient presents with   Follow-up    HPI: Patient is a 37 y.o. male seen today for an acute visit for  Past Medical History:  Diagnosis Date   Allergy    Annual physical exam 08/11/2019   Anxiety    Elevated blood pressure reading without diagnosis of hypertension 08/11/2019   GERD (gastroesophageal reflux disease)    Hx of cholecystectomy 08/11/2019    Past Surgical History:  Procedure Laterality Date   HERNIA REPAIR     LAPAROSCOPIC CHOLECYSTECTOMY  01/2018   by Dr Magnus Ivan    No Known Allergies  Outpatient Encounter Medications as of 06/06/2023  Medication Sig   amLODipine (NORVASC) 10 MG tablet TAKE 1 TABLET BY MOUTH EVERY DAY   busPIRone (BUSPAR) 5 MG tablet TAKE 1 TABLET BY MOUTH TWICE A DAY   cetirizine (ZYRTEC) 10 MG tablet Take 10 mg by mouth daily as needed for allergies.   esomeprazole (NEXIUM) 40 MG capsule TAKE 1 CAPSULE (40 MG TOTAL) BY MOUTH DAILY AT 12 NOON.   No facility-administered encounter medications on file as of 06/06/2023.    Review of Systems:  Review of Systems  Constitutional:  Negative for activity change, appetite change and fever.  HENT:  Negative for sore throat.   Eyes: Negative.   Cardiovascular:  Negative for chest pain and leg swelling.  Gastrointestinal:  Negative for abdominal distention, diarrhea and vomiting.  Genitourinary:  Negative for dysuria, frequency and urgency.  Skin:  Negative for color change.  Neurological:  Positive for headaches. Negative for dizziness.  Psychiatric/Behavioral:  Negative for behavioral problems and sleep disturbance. The patient is not nervous/anxious.     Health Maintenance  Topic Date Due   INFLUENZA VACCINE  04/03/2023   COVID-19 Vaccine (1 - 2023-24 season) Never done   DTaP/Tdap/Td (2 - Td or  Tdap) 08/10/2029   Hepatitis C Screening  Completed   HIV Screening  Completed   HPV VACCINES  Aged Out    Physical Exam: Vitals:   06/06/23 1524 06/06/23 1526  BP: (!) 142/92 (!) 142/90  Pulse: 84   Resp: 18   Temp: 97.9 F (36.6 C)   SpO2: 98%   Weight: 228 lb 3.2 oz (103.5 kg)   Height: 6' (1.829 m)    Body mass index is 30.95 kg/m. Physical Exam Constitutional:      Appearance: Normal appearance.  HENT:     Head: Normocephalic and atraumatic.     Mouth/Throat:     Mouth: Mucous membranes are moist.  Eyes:     Conjunctiva/sclera: Conjunctivae normal.  Cardiovascular:     Rate and Rhythm: Normal rate and regular rhythm.     Pulses: Normal pulses.     Heart sounds: Normal heart sounds.  Pulmonary:     Effort: Pulmonary effort is normal.     Breath sounds: Normal breath sounds.  Abdominal:     General: Bowel sounds are normal.     Palpations: Abdomen is soft.  Musculoskeletal:        General: No swelling. Normal range of motion.     Cervical back: Normal range of motion.  Skin:    General: Skin is warm and dry.  Neurological:     General: No  focal deficit present.     Mental Status: He is alert and oriented to person, place, and time.  Psychiatric:        Mood and Affect: Mood normal.        Behavior: Behavior normal.        Thought Content: Thought content normal.        Judgment: Judgment normal.     Labs reviewed: Basic Metabolic Panel: Recent Labs    12/17/22 0804 06/02/23 0804  NA 137 137  K 4.1 4.2  CL 102 101  CO2 27 27  GLUCOSE 109* 100*  BUN 11 14  CREATININE 0.95 0.90  CALCIUM 9.3 9.1  TSH 2.05  --    Liver Function Tests: Recent Labs    12/17/22 0804 06/02/23 0804  AST 19 17  ALT 24 21  BILITOT 0.8 0.5  PROT 7.3 7.1   No results for input(s): "LIPASE", "AMYLASE" in the last 8760 hours. No results for input(s): "AMMONIA" in the last 8760 hours. CBC: Recent Labs    12/17/22 0804 06/02/23 0804  WBC 4.6 4.2  NEUTROABS  2,374 2,003  HGB 14.5 14.1  HCT 44.4 43.3  MCV 82.2 85.1  PLT 211 215   Lipid Panel: Recent Labs    07/15/22 0830 12/17/22 0804 06/02/23 0804  CHOL 247* 234* 192  HDL 57 44 41  LDLCALC 159* 148* 119*  TRIG 160* 257* 197*  CHOLHDL 4.3 5.3* 4.7   Lab Results  Component Value Date   HGBA1C 5.7 (H) 06/02/2023    Procedures since last visit: No results found.  Assessment/Plan      Labs/tests ordered:  None  Next appt:  Visit date not found

## 2023-06-10 MED ORDER — IBUPROFEN 200 MG PO TABS
400.0000 mg | ORAL_TABLET | Freq: Three times a day (TID) | ORAL | Status: AC | PRN
Start: 2023-06-10 — End: ?

## 2023-06-13 ENCOUNTER — Ambulatory Visit
Admission: RE | Admit: 2023-06-13 | Discharge: 2023-06-13 | Disposition: A | Discharge status: Home / Self Care | Payer: Self-pay | Source: Ambulatory Visit | Attending: Family | Admitting: Family

## 2023-06-13 ENCOUNTER — Encounter: Payer: Self-pay | Admitting: Family

## 2023-06-13 ENCOUNTER — Ambulatory Visit: Payer: BC Managed Care – PPO | Admitting: Family

## 2023-06-13 VITALS — BP 120/70 | HR 87 | Temp 97.3°F | Resp 16 | Ht 72.0 in | Wt 230.2 lb

## 2023-06-13 DIAGNOSIS — R509 Fever, unspecified: Secondary | ICD-10-CM

## 2023-06-13 DIAGNOSIS — R110 Nausea: Secondary | ICD-10-CM

## 2023-06-13 DIAGNOSIS — R519 Headache, unspecified: Secondary | ICD-10-CM | POA: Diagnosis not present

## 2023-06-13 DIAGNOSIS — G44219 Episodic tension-type headache, not intractable: Secondary | ICD-10-CM

## 2023-06-13 DIAGNOSIS — R350 Frequency of micturition: Secondary | ICD-10-CM

## 2023-06-13 LAB — POCT RAPID STREP A (OFFICE): Rapid Strep A Screen: NEGATIVE

## 2023-06-13 LAB — POCT URINALYSIS DIPSTICK (MANUAL)
Leukocytes, UA: NEGATIVE
Nitrite, UA: NEGATIVE
Poct Bilirubin: NEGATIVE
Poct Blood: NEGATIVE
Poct Glucose: NORMAL mg/dL
Poct Ketones: NEGATIVE
Poct Urobilinogen: NORMAL mg/dL
Spec Grav, UA: 1.01 (ref 1.010–1.025)
pH, UA: 6 (ref 5.0–8.0)

## 2023-06-13 NOTE — Progress Notes (Signed)
Provider: Richarda Blade FNP-C  Brandon David, Brandon Citrin, NP  Patient Care Team: Brandon David, Brandon Citrin, NP as PCP - General (Family Medicine)  Extended Emergency Contact Information Primary Emergency Contact: SUBER,BENNET Mobile Phone: [phone] Relation: Significant other Secondary Emergency Contact: Beza,CHRIS Address: [address]          [name]  [address] Home Phone: [phone] Relation: Father  Code Status:  Full Code  Goals of care: Advanced Directive information    06/13/2023   10:49 AM  Advanced Directives  Does Patient Have a Medical Advance Directive? No     Chief Complaint  Patient presents with   Acute Visit    Discuss headache - worried he had meningitis - had headache and fever with nausea    HPI:  Pt is a 37 y.o. male seen today for an acute visit for evaluation of headache and fever one week ago. States movement of head from side to side and bending over worsen the headache.still has bilateral temporal pain.  Had fever 106 with nausea but no vomiting. Temp yesterday was 100 - 101 degrees.last nausea on wednesday evening. Slept well last night without fever. Feels okay today without any fever but still has some dull headache described as stabbing on the temporal,frontal and top of the head  She denies any fever,chills,cough,fatigue,body aches,runny nose,chest tightness,chest pain,palpitation or shortness of breath.  Took ibuprofen without any improvement.     Past Medical History:  Diagnosis Date   Allergy    Annual physical exam 08/11/2019   Anxiety    Elevated blood pressure reading without diagnosis of hypertension 08/11/2019   GERD (gastroesophageal reflux disease)    Hx of cholecystectomy 08/11/2019   Past Surgical History:  Procedure Laterality Date   HERNIA REPAIR     LAPAROSCOPIC CHOLECYSTECTOMY  01/2018   by Dr Magnus Ivan    No Known Allergies  Outpatient Encounter Medications as of 06/13/2023  Medication Sig   amLODipine  (NORVASC) 10 MG tablet Take 1 tablet (10 mg total) by mouth daily.   busPIRone (BUSPAR) 5 MG tablet Take 1 tablet (5 mg total) by mouth 2 (two) times daily.   cetirizine (ZYRTEC) 10 MG tablet Take 10 mg by mouth daily as needed for allergies.   esomeprazole (NEXIUM) 40 MG capsule TAKE 1 CAPSULE (40 MG TOTAL) BY MOUTH DAILY AT 12 NOON.   ibuprofen (ADVIL) 200 MG tablet Take 2 tablets (400 mg total) by mouth every 8 (eight) hours as needed.   No facility-administered encounter medications on file as of 06/13/2023.    Review of Systems  Constitutional:  Negative for appetite change, chills, fatigue, fever and unexpected weight change.  HENT:  Negative for congestion, dental problem, ear discharge, ear pain, facial swelling, hearing loss, nosebleeds, postnasal drip, rhinorrhea, sinus pressure, sinus pain, sneezing, sore throat, tinnitus and trouble swallowing.   Eyes:  Negative for pain, discharge, redness, itching and visual disturbance.  Respiratory:  Negative for cough, chest tightness, shortness of breath and wheezing.   Cardiovascular:  Negative for chest pain, palpitations and leg swelling.  Gastrointestinal:  Positive for nausea. Negative for abdominal distention, abdominal pain, blood in stool, constipation, diarrhea and vomiting.  Genitourinary:  Positive for frequency. Negative for difficulty urinating, dysuria, flank pain and urgency.  Musculoskeletal:  Negative for arthralgias, back pain, gait problem, joint swelling, myalgias, neck pain and neck stiffness.  Skin:  Negative for color change, pallor, rash and wound.  Neurological:  Positive for headaches. Negative for dizziness, syncope, speech difficulty, weakness, light-headedness  and numbness.  Hematological:  Does not bruise/bleed easily.  Psychiatric/Behavioral:  Negative for agitation, behavioral problems, confusion, hallucinations and sleep disturbance. The patient is not nervous/anxious.     Immunization History  Administered  Date(s) Administered   Influenza-Unspecified 04/11/2019   Tdap 08/11/2019   Pertinent  Health Maintenance Due  Topic Date Due   INFLUENZA VACCINE  12/01/2023 (Originally 04/03/2023)      02/25/2022   10:21 AM 07/19/2022    2:56 PM 12/20/2022    3:16 PM 06/06/2023    3:23 PM 06/13/2023   10:49 AM  Fall Risk  Falls in the past year? 0 0 0 0 0  Was there an injury with Fall? 0 0 0    Fall Risk Category Calculator 0 0 0    Fall Risk Category (Retired) Low Low     (RETIRED) Patient Fall Risk Level Low fall risk Low fall risk     Patient at Risk for Falls Due to No Fall Risks No Fall Risks No Fall Risks    Fall risk Follow up Falls evaluation completed Falls evaluation completed      Functional Status Survey:    Vitals:   06/13/23 1050  BP: 120/70  Pulse: 87  Resp: 16  Temp: (!) 97.3 F (36.3 C)  SpO2: 98%  Weight: 230 lb 3.2 oz (104.4 kg)  Height: 6' (1.829 m)   Body mass index is 31.22 kg/m. Physical Exam Vitals reviewed.  Constitutional:      General: He is not in acute distress.    Appearance: Normal appearance. He is obese. He is not ill-appearing or diaphoretic.  HENT:     Head: Normocephalic.     Right Ear: Tympanic membrane, ear canal and external ear normal. There is no impacted cerumen.     Left Ear: Tympanic membrane, ear canal and external ear normal. There is no impacted cerumen.     Nose: Nose normal. No congestion or rhinorrhea.     Mouth/Throat:     Mouth: Mucous membranes are moist.     Pharynx: Oropharynx is clear. No oropharyngeal exudate or posterior oropharyngeal erythema.  Eyes:     General: No scleral icterus.       Right eye: No discharge.        Left eye: No discharge.     Extraocular Movements: Extraocular movements intact.     Conjunctiva/sclera: Conjunctivae normal.     Pupils: Pupils are equal, round, and reactive to light.  Neck:     Vascular: No carotid bruit.  Cardiovascular:     Rate and Rhythm: Normal rate and regular rhythm.      Pulses: Normal pulses.     Heart sounds: Normal heart sounds. No murmur heard.    No friction rub. No gallop.  Pulmonary:     Effort: Pulmonary effort is normal. No respiratory distress.     Breath sounds: Normal breath sounds. No wheezing, rhonchi or rales.  Chest:     Chest wall: No tenderness.  Abdominal:     General: Bowel sounds are normal. There is no distension.     Palpations: Abdomen is soft. There is no mass.     Tenderness: There is no abdominal tenderness. There is no right CVA tenderness, left CVA tenderness, guarding or rebound.  Musculoskeletal:        General: No swelling or tenderness. Normal range of motion.     Cervical back: Normal range of motion. No rigidity or tenderness.     Right  lower leg: No edema.     Left lower leg: No edema.  Lymphadenopathy:     Cervical: No cervical adenopathy.  Skin:    General: Skin is warm and dry.     Coloration: Skin is not pale.     Findings: No bruising, erythema, lesion or rash.  Neurological:     Mental Status: He is alert and oriented to person, place, and time.     Cranial Nerves: No cranial nerve deficit.     Sensory: No sensory deficit.     Motor: No weakness.     Coordination: Coordination normal.     Gait: Gait normal.  Psychiatric:        Mood and Affect: Mood normal.        Speech: Speech normal.        Behavior: Behavior normal.        Thought Content: Thought content normal.        Judgment: Judgment normal.     Labs reviewed: Recent Labs    12/17/22 0804 06/02/23 0804  NA 137 137  K 4.1 4.2  CL 102 101  CO2 27 27  GLUCOSE 109* 100*  BUN 11 14  CREATININE 0.95 0.90  CALCIUM 9.3 9.1   Recent Labs    12/17/22 0804 06/02/23 0804  AST 19 17  ALT 24 21  BILITOT 0.8 0.5  PROT 7.3 7.1   Recent Labs    12/17/22 0804 06/02/23 0804  WBC 4.6 4.2  NEUTROABS 2,374 2,003  HGB 14.5 14.1  HCT 44.4 43.3  MCV 82.2 85.1  PLT 211 215   Lab Results  Component Value Date   TSH 2.05 12/17/2022    Lab Results  Component Value Date   HGBA1C 5.7 (H) 06/02/2023   Lab Results  Component Value Date   CHOL 192 06/02/2023   HDL 41 06/02/2023   LDLCALC 119 (H) 06/02/2023   TRIG 197 (H) 06/02/2023   CHOLHDL 4.7 06/02/2023    Significant Diagnostic Results in last 30 days:  No results found.  Assessment/Plan 1. Urine frequency - POCT Urinalysis Dip Manual negative  - encouraged to increase water intake   2. Fever, unspecified fever cause Temp within normal range here today but took ibuprofen at home. - will obtain CBC to r/o other acute issues.  - CBC with Differential/Platelet - POC Rapid Strep A  negative   3. Episodic tension-type headache, not intractable Worsening episodic headache with nausea. Neuro intact  - will obtain CT scn to rule out other abnormalities - CT HEAD WO CONTRAST ( ); Future  4. Nausea Unclear etiology though associated with headache.  - Basic metabolic panel  Family/ staff Communication: Reviewed plan of care with patient verbalized understanding   Labs/tests ordered:  - CBC with Differential/Platelet - POC Rapid Strep A  negative  - Basic metabolic panel - CT HEAD WO CONTRAST ( ); Future  Next Appointment: Return if symptoms worsen or fail to improve.   Caesar Bookman, NP

## 2023-06-13 NOTE — Patient Instructions (Signed)
-   Notify provider if symptoms worsen or fail to improve  °

## 2023-06-14 LAB — CBC WITH DIFFERENTIAL/PLATELET
Absolute Monocytes: 763 {cells}/uL (ref 200–950)
Basophils Absolute: 42 {cells}/uL (ref 0–200)
Basophils Relative: 0.6 %
Eosinophils Absolute: 70 {cells}/uL (ref 15–500)
Eosinophils Relative: 1 %
HCT: 46 % (ref 38.5–50.0)
Hemoglobin: 15.5 g/dL (ref 13.2–17.1)
Lymphs Abs: 2436 {cells}/uL (ref 850–3900)
MCH: 28.2 pg (ref 27.0–33.0)
MCHC: 33.7 g/dL (ref 32.0–36.0)
MCV: 83.6 fL (ref 80.0–100.0)
MPV: 11 fL (ref 7.5–12.5)
Monocytes Relative: 10.9 %
Neutro Abs: 3689 {cells}/uL (ref 1500–7800)
Neutrophils Relative %: 52.7 %
Platelets: 238 10*3/uL (ref 140–400)
RBC: 5.5 10*6/uL (ref 4.20–5.80)
RDW: 13 % (ref 11.0–15.0)
Total Lymphocyte: 34.8 %
WBC: 7 10*3/uL (ref 3.8–10.8)

## 2023-06-14 LAB — BASIC METABOLIC PANEL
BUN: 10 mg/dL (ref 7–25)
CO2: 26 mmol/L (ref 20–32)
Calcium: 10.4 mg/dL — ABNORMAL HIGH (ref 8.6–10.3)
Chloride: 99 mmol/L (ref 98–110)
Creat: 1.05 mg/dL (ref 0.60–1.26)
Glucose, Bld: 121 mg/dL (ref 65–139)
Potassium: 4 mmol/L (ref 3.5–5.3)
Sodium: 136 mmol/L (ref 135–146)

## 2023-06-17 ENCOUNTER — Other Ambulatory Visit: Payer: Self-pay | Admitting: Family

## 2023-06-17 DIAGNOSIS — J341 Cyst and mucocele of nose and nasal sinus: Secondary | ICD-10-CM | POA: Insufficient documentation

## 2023-06-25 ENCOUNTER — Encounter (INDEPENDENT_AMBULATORY_CARE_PROVIDER_SITE_OTHER): Payer: Self-pay | Admitting: Otolaryngology

## 2023-06-27 ENCOUNTER — Other Ambulatory Visit: Payer: Self-pay

## 2023-09-15 ENCOUNTER — Other Ambulatory Visit: Payer: BC Managed Care – PPO

## 2023-09-18 ENCOUNTER — Ambulatory Visit: Payer: BC Managed Care – PPO | Admitting: Family

## 2023-09-26 ENCOUNTER — Institutional Professional Consult (permissible substitution) (INDEPENDENT_AMBULATORY_CARE_PROVIDER_SITE_OTHER): Payer: Self-pay

## 2023-10-13 ENCOUNTER — Other Ambulatory Visit: Payer: BC Managed Care – PPO

## 2023-10-13 ENCOUNTER — Other Ambulatory Visit (HOSPITAL_COMMUNITY): Payer: Self-pay

## 2023-10-14 ENCOUNTER — Other Ambulatory Visit (HOSPITAL_COMMUNITY): Payer: Self-pay

## 2023-10-17 ENCOUNTER — Ambulatory Visit: Payer: BC Managed Care – PPO | Admitting: Family

## 2023-10-20 ENCOUNTER — Other Ambulatory Visit (HOSPITAL_COMMUNITY): Payer: Self-pay

## 2023-10-29 ENCOUNTER — Other Ambulatory Visit (HOSPITAL_COMMUNITY): Payer: Self-pay

## 2023-10-29 MED FILL — Esomeprazole Magnesium Cap Delayed Release 40 MG (Base Eq): ORAL | 60 days supply | Qty: 60 | Fill #0 | Status: AC

## 2023-11-10 ENCOUNTER — Other Ambulatory Visit

## 2023-11-10 DIAGNOSIS — R7989 Other specified abnormal findings of blood chemistry: Secondary | ICD-10-CM

## 2023-11-11 ENCOUNTER — Other Ambulatory Visit: Payer: BC Managed Care – PPO

## 2023-11-11 LAB — BASIC METABOLIC PANEL
BUN: 11 mg/dL (ref 7–25)
CO2: 28 mmol/L (ref 20–32)
Calcium: 9.3 mg/dL (ref 8.6–10.3)
Chloride: 103 mmol/L (ref 98–110)
Creat: 0.82 mg/dL (ref 0.60–1.26)
Glucose, Bld: 108 mg/dL — ABNORMAL HIGH (ref 65–99)
Potassium: 4 mmol/L (ref 3.5–5.3)
Sodium: 139 mmol/L (ref 135–146)

## 2023-11-14 ENCOUNTER — Ambulatory Visit: Payer: BC Managed Care – PPO | Admitting: Family

## 2023-11-14 ENCOUNTER — Encounter: Payer: Self-pay | Admitting: Family

## 2023-11-14 ENCOUNTER — Other Ambulatory Visit (HOSPITAL_COMMUNITY): Payer: Self-pay

## 2023-11-14 VITALS — BP 124/72 | HR 90 | Temp 97.9°F | Resp 20 | Ht 72.0 in | Wt 227.2 lb

## 2023-11-14 DIAGNOSIS — R7303 Prediabetes: Secondary | ICD-10-CM

## 2023-11-14 DIAGNOSIS — E785 Hyperlipidemia, unspecified: Secondary | ICD-10-CM

## 2023-11-14 DIAGNOSIS — F411 Generalized anxiety disorder: Secondary | ICD-10-CM

## 2023-11-14 DIAGNOSIS — I10 Essential (primary) hypertension: Secondary | ICD-10-CM

## 2023-11-14 MED ORDER — BUSPIRONE HCL 5 MG PO TABS
5.0000 mg | ORAL_TABLET | Freq: Two times a day (BID) | ORAL | 1 refills | Status: AC
  Filled 2023-11-14: qty 180, 90d supply, fill #0
  Filled 2024-03-16: qty 180, 90d supply, fill #1

## 2023-11-14 MED ORDER — AMLODIPINE BESYLATE 10 MG PO TABS
10.0000 mg | ORAL_TABLET | Freq: Every day | ORAL | 1 refills | Status: DC
  Filled 2023-11-14 – 2023-12-16 (×2): qty 90, 90d supply, fill #0
  Filled 2024-03-16: qty 90, 90d supply, fill #1

## 2023-11-15 LAB — LIPID PANEL
Cholesterol: 220 mg/dL — ABNORMAL HIGH (ref <200)
HDL: 45 mg/dL (ref >=40)
LDL Cholesterol (Calc): 126 mg/dL — ABNORMAL HIGH
Non-HDL Cholesterol (Calc): 175 mg/dL — ABNORMAL HIGH (ref <130)
Total CHOL/HDL Ratio: 4.9 (calc) (ref <5.0)
Triglycerides: 340 mg/dL — ABNORMAL HIGH (ref <150)

## 2023-11-15 LAB — HEMOGLOBIN A1C
Hgb A1c MFr Bld: 5.7 %{Hb} — ABNORMAL HIGH (ref <5.7)
Mean Plasma Glucose: 117 mg/dL
eAG (mmol/L): 6.5 mmol/L

## 2023-11-20 ENCOUNTER — Telehealth (INDEPENDENT_AMBULATORY_CARE_PROVIDER_SITE_OTHER): Payer: Self-pay | Admitting: Otolaryngology

## 2023-11-20 NOTE — Telephone Encounter (Signed)
 Confirmed appt & location [phone] afm

## 2023-11-21 ENCOUNTER — Other Ambulatory Visit (HOSPITAL_COMMUNITY): Payer: Self-pay

## 2023-11-21 ENCOUNTER — Encounter (INDEPENDENT_AMBULATORY_CARE_PROVIDER_SITE_OTHER): Payer: Self-pay

## 2023-11-21 ENCOUNTER — Ambulatory Visit (INDEPENDENT_AMBULATORY_CARE_PROVIDER_SITE_OTHER): Payer: Self-pay | Admitting: Otolaryngology

## 2023-11-21 VITALS — BP 156/89 | HR 115 | Ht 72.0 in | Wt 228.0 lb

## 2023-11-21 DIAGNOSIS — J343 Hypertrophy of nasal turbinates: Secondary | ICD-10-CM

## 2023-11-21 DIAGNOSIS — J3489 Other specified disorders of nose and nasal sinuses: Secondary | ICD-10-CM | POA: Diagnosis not present

## 2023-11-21 DIAGNOSIS — J341 Cyst and mucocele of nose and nasal sinus: Secondary | ICD-10-CM

## 2023-11-21 DIAGNOSIS — R0981 Nasal congestion: Secondary | ICD-10-CM

## 2023-11-21 DIAGNOSIS — J3089 Other allergic rhinitis: Secondary | ICD-10-CM | POA: Diagnosis not present

## 2023-11-21 DIAGNOSIS — J342 Deviated nasal septum: Secondary | ICD-10-CM

## 2023-11-21 MED ORDER — AZELASTINE HCL 0.1 % NA SOLN
2.0000 | Freq: Two times a day (BID) | NASAL | 12 refills | Status: AC | PRN
  Filled 2023-11-21: qty 30, 25d supply, fill #0

## 2023-11-21 NOTE — Patient Instructions (Signed)
 Use astelin nasal spray two sprays each nostril as needed up to twice a day for significant runny nose or congestion.   CPT [id number] and CPT 30140 --- septoplasty and inferior turbinate reduction.

## 2023-11-21 NOTE — Progress Notes (Signed)
 Dear Dr. Elam Dutch, Here is my assessment for our mutual patient, Brandon David. Thank you for allowing me the opportunity to care for your patient. Please do not hesitate to contact me should you have any other questions. Sincerely, Dr. Jovita Kussmaul  Otolaryngology Clinic Note  HISTORY: Brandon David is a 38 y.o. male kindly referred by Dr. Elam Dutch for evaluation of maxillary sinus cyst.  Initial visit (11/2023): He reports no history of frequent sinus infections - pain/pressure in face, hyposmia, discolored drainage. No frequent abx/steroids. Does report that he has some trouble breathing out of the right side of the nose and some congestion for several years. He does have some typical AR symptoms - itchy nose, itchy eyes. Has to blow his nose frequently. He has also tried flonase but did not help significantly. He is currently on zyrtec. Has tried afrin, helps. Current symptoms include no nasal symptoms beside nasal obstruction/congestion. Allergy testing has not been done. No previous sinonasal surgery.  He is currently using zyrtec.  GLP-1: no AP/AC: no  Tobacco: occasional cigarette use  PMHx: HTN, HLD, GAD  RADIOGRAPHIC EVALUATION AND INDEPENDENT REVIEW OF OTHER RECORDS:: Dinah Ngetich (06/13/2023): noted headache and fever, with temporal pain and fever; no URI sx; Dx: Headache, Rx: CT Head CT Head showed maxillary sinus cyst and referred here. CT Head w/o Contrast 06/13/2023 independently reviewed and interpreted independently with respect to sinuses: left maxillary sinus small mucous retention cyst; significant right septal deviation and left > right inferior turbinate hypertrophy CBC WBC 7.0, Hgb 15.5, Plt 238, Eos 70 BMP BUN 10/1.05   Past Medical History:  Diagnosis Date   Allergy    Annual physical exam 08/11/2019   Anxiety    Elevated blood pressure reading without diagnosis of hypertension 08/11/2019   GERD (gastroesophageal reflux disease)    Hx of cholecystectomy  08/11/2019   Past Surgical History:  Procedure Laterality Date   HERNIA REPAIR     LAPAROSCOPIC CHOLECYSTECTOMY  01/2018   by Dr Magnus Ivan   Family History  Problem Relation Age of Onset   Cancer Maternal Grandmother    COPD Maternal Grandfather    Heart disease Paternal Grandfather    Social History   Tobacco Use   Smoking status: Former    Types: E-cigarettes   Smokeless tobacco: Former  Substance Use Topics   Alcohol use: Yes    Alcohol/week: 10.0 - 20.0 standard drinks of alcohol    Types: 10 - 20 Standard drinks or equivalent per week   No Known Allergies Current Outpatient Medications  Medication Sig Dispense Refill   amLODipine (NORVASC) 10 MG tablet Take 1 tablet (10 mg total) by mouth daily. 90 tablet 1   azelastine (ASTELIN) 0.1 % nasal spray Place 2 sprays into both nostrils 2 (two) times daily as needed for rhinitis. Use in each nostril as directed 30 mL 12   busPIRone (BUSPAR) 5 MG tablet Take 1 tablet (5 mg total) by mouth 2 (two) times daily. 180 tablet 1   cetirizine (ZYRTEC) 10 MG tablet Take 10 mg by mouth daily as needed for allergies.     esomeprazole (NEXIUM) 40 MG capsule Take 1 capsule (40 mg total) by mouth daily at 12 noon. 90 capsule 3   ibuprofen (ADVIL) 200 MG tablet Take 2 tablets (400 mg total) by mouth every 8 (eight) hours as needed.     No current facility-administered medications for this visit.   BP (!) 156/89 (BP Location: Left Arm, Patient Position: Sitting, Cuff Size: Normal)  Pulse (!) 115   Ht 6' (1.829 m)   Wt 228 lb (103.4 kg)   SpO2 95%   BMI 30.92 kg/m   PHYSICAL EXAM:  BP (!) 156/89 (BP Location: Left Arm, Patient Position: Sitting, Cuff Size: Normal)   Pulse (!) 115   Ht 6' (1.829 m)   Wt 228 lb (103.4 kg)   SpO2 95%   BMI 30.92 kg/m    Salient findings:  CN II-XII intact Bilateral EAC clear and TM intact with well pneumatized middle ear spaces Weber 512: Rinne 512: AC > BC b/l Nose: Anterior rhinoscopy reveals  septal deviation right, L > R inferior turbinate hypertrophy.  Nasal endoscopy was indicated to better evaluate the nose and paranasal sinuses, given the patient's history and exam findings, and is detailed below. No lesions of oral cavity/oropharynx; dentition good No obviously palpable neck masses/lymphadenopathy/thyromegaly No respiratory distress or stridor   PROCEDURE: Diagnostic Nasal Endoscopy Pre-procedure diagnosis: Concern for maxillary sinusitis, nasal obstruction Post-procedure diagnosis: same Indication: See pre-procedure diagnosis and physical exam above Complications: None apparent EBL: 0 mL Anesthesia: Lidocaine 4% and topical decongestant was topically sprayed in each nasal cavity  Description of Procedure:  Patient was identified. A rigid 30 degree endoscope was utilized to evaluate the sinonasal cavities, mucosa, sinus ostia and turbinates and septum.  Overall, signs of mucosal inflammation are not noted.  No mucopurulence, polyps, or masses noted.   Right Middle meatus: clear Right SE Recess: clear Left MM: clear Left SE Recess: clear Right septal deviation, L > R inferior turbinate hypertrophy Photodocumentation was obtained.  CPT CODE -- 31231 - Mod 25   ASSESSMENT:  38 y.o. with:  1. Maxillary sinus cyst   2. Nasal obstruction   3. Nasal congestion   4. Nasal septal deviation   5. Hypertrophy of both inferior nasal turbinates   6. Perennial allergic rhinitis    No significant symptoms of sinusitis; CT reviewed and discussed, most likely retention cyst as he is otherwise asymptomatic. We discussed options and he opted for observation. For rhinitis, will start astelin BID PRN and continue PO antihistamine. We also discussed his significant septal deviation and obstruction symptoms, persistent despite medical management with flonase trial and PO antihistamine. He would be a good candidate for septo/turbs. Offered but he will check with his insurance company and  decide from there based on cost.  1) Start astelin BID PRN; continue nasal regimen; otherwise sx are well controlled so will defer RAST 2) can schedule septo/turbs if he wishes to pursue  See below regarding exact medications prescribed this encounter including dosages and route: Meds ordered this encounter  Medications   azelastine (ASTELIN) 0.1 % nasal spray    Sig: Place 2 sprays into both nostrils 2 (two) times daily as needed for rhinitis. Use in each nostril as directed    Dispense:  30 mL    Refill:  12     Thank you for allowing me the opportunity to care for your patient. Please do not hesitate to contact me should you have any other questions.  Sincerely, Jovita Kussmaul, MD Otolaryngologist (ENT), [facility] Phone: [phone] Fax: [phone]  MDM:  Level 4: [phone] Complexity/Problems addressed: mod - multiple chronic problems, stable Data complexity: mod - independent interpretation of CT imaging - Morbidity: mod   - Prescription Drug prescribed or managed: yes  11/21/2023, 3:55 PM

## 2023-11-23 NOTE — Progress Notes (Signed)
 Provider: Richarda Blade FNP-C   Tabb Croghan, Donalee Citrin, NP  Patient Care Team: Cayne Yom, Donalee Citrin, NP as PCP - General (Family Medicine)  Extended Emergency Contact Information Primary Emergency Contact: SUBER,BENNET Mobile Phone: [phone] Relation: Significant other Secondary Emergency Contact: Leyh,CHRIS Address: [address]          [name]  [address] Home Phone: [phone] Relation: Father  Code Status:  Full code  Goals of care: Advanced Directive information    11/14/2023    3:24 PM  Advanced Directives  Does Patient Have a Medical Advance Directive? No  Would patient like information on creating a medical advance directive? No - Patient declined     Chief Complaint  Patient presents with   Medical Management of Chronic Issues    3 month follow up and discuss covid vaccine.      Discussed the use of AI scribe software for clinical note transcription with the patient, who gave verbal consent to proceed.  History of Present Illness   Brandon David is a 38 year old male with hypertension who presents for a three-month follow-up visit.  Blood pressure has improved since the last visit, now measuring 120/unknown, compared to the 140s previously. He attributes some of the previous elevation to anxiety about doctor visits. He is currently taking amlodipine 10 mg for blood pressure control.  Headaches, which previously lasted seven to eight days with a peak and then subsided, have resolved. He experienced fevers typically in the evening around 6 or 7 PM, with temperatures reaching 100F.  He has a history of high glucose levels, with a recent BMP showing glucose at 108 mg/dL. He recalls eating a cookie before the test, which may have affected the results. He is concerned about the possibility of diabetes and is awaiting further testing.  He has a history of sinus issues, with a CT scan revealing a blockage in the maxillary sinus. He experiences snoring and  suspects it may be related to the sinus blockage. He has an upcoming appointment with an ENT specialist to address these concerns.  He has experienced a slight weight loss, now weighing 227 pounds, down from 230 pounds. He attributes the lack of significant lifestyle changes to personal circumstances, including his wife's job change and issues with his race car, which have impacted his ability to focus on diet and exercise.  He consumes alcohol daily, typically one or two beers, and acknowledges the potential impact on weight and health.  He continues to take Zyrtec for allergies and Nexium for heartburn. Missing a dose of Nexium results in noticeable heartburn symptoms. He also takes buspirone 5 mg twice daily for anxiety and uses ibuprofen as needed for pain.  No current headaches, fever, constipation, nausea, vomiting, and reports sleeping well at night. He notes a history of snoring, potentially related to sinus issues.  Past Medical History:  Diagnosis Date   Allergy    Annual physical exam 08/11/2019   Anxiety    Elevated blood pressure reading without diagnosis of hypertension 08/11/2019   GERD (gastroesophageal reflux disease)    Hx of cholecystectomy 08/11/2019   Past Surgical History:  Procedure Laterality Date   HERNIA REPAIR     LAPAROSCOPIC CHOLECYSTECTOMY  01/2018   by Dr Magnus Ivan    No Known Allergies  Allergies as of 11/14/2023   No Known Allergies      Medication List        Accurate as of November 14, 2023 11:59 PM. If you have  any questions, ask your nurse or doctor.          amLODipine 10 MG tablet Commonly known as: NORVASC Take 1 tablet (10 mg total) by mouth daily.   busPIRone 5 MG tablet Commonly known as: BUSPAR Take 1 tablet (5 mg total) by mouth 2 (two) times daily.   cetirizine 10 MG tablet Commonly known as: ZYRTEC Take 10 mg by mouth daily as needed for allergies.   esomeprazole 40 MG capsule Commonly known as: NEXIUM Take 1 capsule (40  mg total) by mouth daily at 12 noon.   ibuprofen 200 MG tablet Commonly known as: Advil Take 2 tablets (400 mg total) by mouth every 8 (eight) hours as needed.        Review of Systems  Constitutional:  Negative for appetite change, chills, fatigue, fever and unexpected weight change.  HENT:  Negative for congestion, dental problem, ear discharge, ear pain, facial swelling, hearing loss, nosebleeds, postnasal drip, rhinorrhea, sinus pressure, sinus pain, sneezing, sore throat, tinnitus and trouble swallowing.        Seasonal allergies   Eyes:  Negative for pain, discharge, redness, itching and visual disturbance.  Respiratory:  Negative for cough, chest tightness, shortness of breath and wheezing.   Cardiovascular:  Negative for chest pain, palpitations and leg swelling.  Gastrointestinal:  Negative for abdominal distention, abdominal pain, blood in stool, constipation, diarrhea, nausea and vomiting.       Heartburn controlled on Nexium   Endocrine: Negative for cold intolerance, heat intolerance, polydipsia, polyphagia and polyuria.  Genitourinary:  Negative for difficulty urinating, dysuria, flank pain, frequency and urgency.  Musculoskeletal:  Negative for arthralgias, back pain, gait problem, joint swelling, myalgias, neck pain and neck stiffness.  Skin:  Negative for color change, pallor, rash and wound.  Neurological:  Negative for dizziness, syncope, speech difficulty, weakness, light-headedness, numbness and headaches.  Hematological:  Does not bruise/bleed easily.  Psychiatric/Behavioral:  Negative for agitation, behavioral problems, confusion, hallucinations, self-injury, sleep disturbance and suicidal ideas. The patient is nervous/anxious.     Immunization History  Administered Date(s) Administered   Influenza-Unspecified 04/11/2019   Tdap 08/11/2019   Unspecified SARS-COV-2 Vaccination 03/13/2020, 04/03/2020   Pertinent  Health Maintenance Due  Topic Date Due    INFLUENZA VACCINE  12/01/2023 (Originally 04/03/2023)      02/25/2022   10:21 AM 07/19/2022    2:56 PM 12/20/2022    3:16 PM 06/06/2023    3:23 PM 06/13/2023   10:49 AM  Fall Risk  Falls in the past year? 0 0 0 0 0  Was there an injury with Fall? 0 0 0    Fall Risk Category Calculator 0 0 0    Fall Risk Category (Retired) Low Low     (RETIRED) Patient Fall Risk Level Low fall risk Low fall risk     Patient at Risk for Falls Due to No Fall Risks No Fall Risks No Fall Risks    Fall risk Follow up Falls evaluation completed Falls evaluation completed      Functional Status Survey:    Vitals:   11/14/23 1530  BP: 124/72  Pulse: 90  Resp: 20  Temp: 97.9 F (36.6 C)  SpO2: 97%  Weight: 227 lb 3.2 oz (103.1 kg)  Height: 6' (1.829 m)   Body mass index is 30.81 kg/m. Physical Exam   VITALS: T- 97.9, P- 70, BP- 120/, SaO2- 97% MEASUREMENTS: Weight- 227 lbs. GENERAL: Alert, cooperative, well developed, no acute distress. HEENT: Normocephalic, normal oropharynx,  moist mucous membranes. CHEST: Clear to auscultation bilaterally, no wheezes, rhonchi, or crackles. CARDIOVASCULAR: Normal heart rate and rhythm, S1 and S2 normal without murmurs. ABDOMEN: Soft, non-tender, non-distended, without organomegaly, normal bowel sounds. EXTREMITIES: No cyanosis or edema. MUSCULOSKELETAL: Hips normal range of motion, non-tender. NEUROLOGICAL: Cranial nerves grossly intact, moves all extremities without gross motor or sensory deficit. PSYCHIATRY/BEHAVIORAL: Mood stable      Labs reviewed: Recent Labs    06/02/23 0804 06/13/23 1129 11/10/23 0828  NA 137 136 139  K 4.2 4.0 4.0  CL 101 99 103  CO2 27 26 28   GLUCOSE 100* 121 108*  BUN 14 10 11   CREATININE 0.90 1.05 0.82  CALCIUM 9.1 10.4* 9.3   Recent Labs    12/17/22 0804 06/02/23 0804  AST 19 17  ALT 24 21  BILITOT 0.8 0.5  PROT 7.3 7.1   Recent Labs    12/17/22 0804 06/02/23 0804 06/13/23 1129  WBC 4.6 4.2 7.0  NEUTROABS  2,374 2,003 3,689  HGB 14.5 14.1 15.5  HCT 44.4 43.3 46.0  MCV 82.2 85.1 83.6  PLT 211 215 238   Lab Results  Component Value Date   TSH 2.05 12/17/2022   Lab Results  Component Value Date   HGBA1C 5.7 (H) 11/14/2023   Lab Results  Component Value Date   CHOL 220 (H) 11/14/2023   HDL 45 11/14/2023   LDLCALC 126 (H) 11/14/2023   TRIG 340 (H) 11/14/2023   CHOLHDL 4.9 11/14/2023    Significant Diagnostic Results in last 30 days:  No results found.  Assessment/Plan  Elevated Blood Glucose Recent BMP showed glucose level of 108 mg/dL, improved from previous levels in the 120s. He consumed a cookie prior to the test, potentially affecting results. No A1c test was performed; need to assess for diabetes. - Order A1c test to evaluate for diabetes.  Essential Hypertension Blood pressure is well-controlled at 120/70 mmHg, improved from previous readings in the 140s. He reports feeling nervous during visits, which may have contributed to prior elevated readings. Current medication regimen includes amlodipine 10 mg daily. - Refill amlodipine 10 mg at [facility], prefer 90-day supply if available.  Generalized Anxiety disorder  Anxiety is managed with buspirone 5 mg twice daily. He reports no new issues related to anxiety. - Refill buspirone 5 mg at [facility], prefer 90-day supply if available.  Gastroesophageal Reflux Disease (GERD) He continues to experience heartburn if Nexium is missed. Current management with Nexium is effective. - Continue Nexium as prescribed.  Obstructive Sleep Apnea (OSA) He reports snoring and poor sleep quality, potentially related to maxillary sinus blockage identified on previous CT scan. He has an upcoming ENT appointment to evaluate sinus blockage and its contribution to snoring and sleep quality. - Await ENT evaluation and recommendations regarding sinus blockage and potential treatment for OSA.  General  Health Maintenance Weight has decreased from 230 lbs to 227 lbs. He acknowledges the need for lifestyle changes, including diet and exercise, but has faced challenges due to personal circumstances. Alcohol consumption is daily, with potential impact on weight and health. - Encourage continued weight management through diet and exercise. - Advise reducing alcohol consumption, particularly beer, to aid in weight management.  Family/ staff Communication: Reviewed plan of care with patient verbalized understanding   Labs/tests ordered:  - Lipid Panel  - Hgb A 1 C    Next Appointment : Return in about 6 months (around 05/16/2024) for medical mangement of chronic issues.,  Fasting labs in 6 months prior to visit.   Spent 30 minutes of Face to face and non-face to face with patient  >50% time spent counseling; reviewing medical record; tests; labs; documentation and developing future plan of care.   Caesar Bookman, NP

## 2023-12-16 ENCOUNTER — Other Ambulatory Visit (HOSPITAL_COMMUNITY): Payer: Self-pay

## 2023-12-23 ENCOUNTER — Other Ambulatory Visit (HOSPITAL_COMMUNITY): Payer: Self-pay

## 2023-12-23 ENCOUNTER — Other Ambulatory Visit: Payer: Self-pay | Admitting: Family

## 2023-12-23 DIAGNOSIS — K219 Gastro-esophageal reflux disease without esophagitis: Secondary | ICD-10-CM

## 2023-12-23 MED ORDER — ESOMEPRAZOLE MAGNESIUM 40 MG PO CPDR
40.0000 mg | DELAYED_RELEASE_CAPSULE | Freq: Every day | ORAL | 3 refills | Status: AC
  Filled 2023-12-23 – 2023-12-29 (×2): qty 90, 90d supply, fill #0
  Filled 2024-03-16: qty 90, 90d supply, fill #1
  Filled 2024-06-21: qty 90, 90d supply, fill #2
  Filled 2024-09-22: qty 90, 90d supply, fill #3

## 2023-12-24 ENCOUNTER — Other Ambulatory Visit: Payer: Self-pay

## 2023-12-24 ENCOUNTER — Encounter: Payer: Self-pay | Admitting: Pharmacist

## 2023-12-24 ENCOUNTER — Other Ambulatory Visit (HOSPITAL_COMMUNITY): Payer: Self-pay

## 2023-12-29 ENCOUNTER — Other Ambulatory Visit (HOSPITAL_COMMUNITY): Payer: Self-pay

## 2023-12-29 ENCOUNTER — Other Ambulatory Visit: Payer: Self-pay

## 2024-03-16 ENCOUNTER — Other Ambulatory Visit (HOSPITAL_COMMUNITY): Payer: Self-pay

## 2024-05-21 ENCOUNTER — Other Ambulatory Visit: Payer: Self-pay

## 2024-05-21 DIAGNOSIS — I10 Essential (primary) hypertension: Secondary | ICD-10-CM

## 2024-05-21 DIAGNOSIS — R7303 Prediabetes: Secondary | ICD-10-CM

## 2024-05-21 DIAGNOSIS — E785 Hyperlipidemia, unspecified: Secondary | ICD-10-CM

## 2024-05-24 ENCOUNTER — Other Ambulatory Visit

## 2024-05-25 ENCOUNTER — Ambulatory Visit: Payer: Self-pay | Admitting: Family

## 2024-05-25 LAB — COMPREHENSIVE METABOLIC PANEL WITH GFR
AG Ratio: 1.6 (calc) (ref 1.0–2.5)
ALT: 20 U/L (ref 9–46)
AST: 24 U/L (ref 10–40)
Albumin: 4.6 g/dL (ref 3.6–5.1)
Alkaline phosphatase (APISO): 59 U/L (ref 36–130)
BUN: 12 mg/dL (ref 7–25)
CO2: 29 mmol/L (ref 20–32)
Calcium: 9.4 mg/dL (ref 8.6–10.3)
Chloride: 101 mmol/L (ref 98–110)
Creat: 0.97 mg/dL (ref 0.60–1.26)
Globulin: 2.8 g/dL (ref 1.9–3.7)
Glucose, Bld: 106 mg/dL — ABNORMAL HIGH (ref 65–99)
Potassium: 4.1 mmol/L (ref 3.5–5.3)
Sodium: 138 mmol/L (ref 135–146)
Total Bilirubin: 0.9 mg/dL (ref 0.2–1.2)
Total Protein: 7.4 g/dL (ref 6.1–8.1)
eGFR: 103 mL/min/1.73m2 (ref >=60)

## 2024-05-25 LAB — CBC WITH DIFFERENTIAL/PLATELET
Absolute Lymphocytes: 1550 {cells}/uL (ref 850–3900)
Absolute Monocytes: 437 {cells}/uL (ref 200–950)
Basophils Absolute: 21 {cells}/uL (ref 0–200)
Basophils Relative: 0.5 %
Eosinophils Absolute: 101 {cells}/uL (ref 15–500)
Eosinophils Relative: 2.4 %
HCT: 43.9 % (ref 38.5–50.0)
Hemoglobin: 14.3 g/dL (ref 13.2–17.1)
MCH: 27.8 pg (ref 27.0–33.0)
MCHC: 32.6 g/dL (ref 32.0–36.0)
MCV: 85.2 fL (ref 80.0–100.0)
MPV: 11.4 fL (ref 7.5–12.5)
Monocytes Relative: 10.4 %
Neutro Abs: 2092 {cells}/uL (ref 1500–7800)
Neutrophils Relative %: 49.8 %
Platelets: 209 Thousand/uL (ref 140–400)
RBC: 5.15 Million/uL (ref 4.20–5.80)
RDW: 13.5 % (ref 11.0–15.0)
Total Lymphocyte: 36.9 %
WBC: 4.2 Thousand/uL (ref 3.8–10.8)

## 2024-05-25 LAB — LIPID PANEL
Cholesterol: 231 mg/dL — ABNORMAL HIGH (ref <200)
HDL: 46 mg/dL (ref >=40)
LDL Cholesterol (Calc): 151 mg/dL — ABNORMAL HIGH
Non-HDL Cholesterol (Calc): 185 mg/dL — ABNORMAL HIGH (ref <130)
Total CHOL/HDL Ratio: 5 (calc) — ABNORMAL HIGH (ref <5.0)
Triglycerides: 197 mg/dL — ABNORMAL HIGH (ref <150)

## 2024-05-25 LAB — HEMOGLOBIN A1C
Hgb A1c MFr Bld: 5.5 % (ref <5.7)
Mean Plasma Glucose: 111 mg/dL
eAG (mmol/L): 6.2 mmol/L

## 2024-05-25 LAB — TSH: TSH: 1.35 m[IU]/L (ref 0.40–4.50)

## 2024-05-28 ENCOUNTER — Ambulatory Visit: Admitting: Family

## 2024-05-31 ENCOUNTER — Encounter: Payer: Self-pay | Admitting: Family

## 2024-05-31 ENCOUNTER — Ambulatory Visit (INDEPENDENT_AMBULATORY_CARE_PROVIDER_SITE_OTHER): Admitting: Family

## 2024-05-31 VITALS — BP 130/82 | HR 92 | Temp 97.8°F | Resp 19 | Ht 72.0 in | Wt 226.2 lb

## 2024-05-31 DIAGNOSIS — F411 Generalized anxiety disorder: Secondary | ICD-10-CM

## 2024-05-31 DIAGNOSIS — E785 Hyperlipidemia, unspecified: Secondary | ICD-10-CM

## 2024-05-31 DIAGNOSIS — K529 Noninfective gastroenteritis and colitis, unspecified: Secondary | ICD-10-CM | POA: Diagnosis not present

## 2024-05-31 DIAGNOSIS — R7303 Prediabetes: Secondary | ICD-10-CM | POA: Diagnosis not present

## 2024-05-31 DIAGNOSIS — Z1211 Encounter for screening for malignant neoplasm of colon: Secondary | ICD-10-CM | POA: Diagnosis not present

## 2024-05-31 DIAGNOSIS — I10 Essential (primary) hypertension: Secondary | ICD-10-CM

## 2024-05-31 DIAGNOSIS — Z800 Family history of malignant neoplasm of digestive organs: Secondary | ICD-10-CM | POA: Insufficient documentation

## 2024-05-31 DIAGNOSIS — K219 Gastro-esophageal reflux disease without esophagitis: Secondary | ICD-10-CM | POA: Diagnosis not present

## 2024-05-31 NOTE — Progress Notes (Signed)
 Provider: Roxan Plough FNP-C   Bobbijo Holst, Roxan BROCKS, NP  Patient Care Team: Chrisean Kloth, Roxan BROCKS, NP as PCP - General (Family Medicine)  Extended Emergency Contact Information Primary Emergency Contact: SUBER,BENNET Mobile Phone: [phone] Relation: Significant other Secondary Emergency Contact: Peruski,CHRIS Address: [address]          [place] [address] Home Phone: [phone] Relation: Father  Code Status:  Full Code  Goals of care: Advanced Directive information    05/31/2024    3:27 PM  Advanced Directives  Does Patient Have a Medical Advance Directive? No  Would patient like information on creating a medical advance directive? No - Patient declined     Chief Complaint  Patient presents with   Medical Management of Chronic Issues    6 Month follow up.     Discussed the use of AI scribe software for clinical note transcription with the patient, who gave verbal consent to proceed.  History of Present Illness   Brandon David is a 38 year old male who presents for a six-month follow-up.  He has hypertension and elevated cholesterol. He feels anxious, attributing it to a conversation prior to the appointment, which temporarily increased his heart rate.  He is actively working on [employer], noting a decrease from 228 to 226 pounds. He exercises three to four times a week, primarily using the elliptical machine, and has recently incorporated pushups and sit-ups into his routine. He is concerned about his diet, particularly his fondness for Oreos and occasional ice cream consumption, although he tries to limit these. He regularly eats chicken, fish, shrimp, and salads, with occasional steak.  Recent lab work shows improvement in triglycerides, which have decreased from 340 to 197 mg/dL, and his J8r has improved from 5.7% to 5.5%. However, his LDL cholesterol remains elevated at 151 mg/dL, and total cholesterol is 231 mg/dL.  He is concerned about his  gastrointestinal health due to a family history of colon cancer; his mother had colon cancer in her late fifties. He has no gallbladder, which affects his bowel habits, requiring a specific morning routine to manage diarrhea. He is interested in seeing a gastroenterologist for further evaluation. He reports intermittent diarrhea, which he manages with a specific morning routine due to his history of gallbladder removal.  He is currently taking amlodipine  10 mg for blood pressure, buspirone  5 mg twice daily, Zyrtec as needed, ibuprofen  as needed, and Nexium  40 mg daily. He no longer takes Protonix .  He reports a past incident of multiple yellow jacket stings over the summer, which caused significant swelling in his leg for about three weeks. He managed the reaction with Benadryl.    Past Medical History:  Diagnosis Date   Allergy    Annual physical exam 08/11/2019   Anxiety    Elevated blood pressure reading without diagnosis of hypertension 08/11/2019   GERD (gastroesophageal reflux disease)    Hx of cholecystectomy 08/11/2019   Past Surgical History:  Procedure Laterality Date   HERNIA REPAIR     LAPAROSCOPIC CHOLECYSTECTOMY  01/2018   by Dr Vernetta    No Known Allergies  Allergies as of 05/31/2024   No Known Allergies      Medication List        Accurate as of May 31, 2024  9:54 PM. If you have any questions, ask your nurse or doctor.          STOP taking these medications    pantoprazole  40 MG tablet Commonly known as:  PROTONIX  Stopped by: Roxan BROCKS Algernon Mundie       TAKE these medications    amLODipine  10 MG tablet Commonly known as: NORVASC  Take 1 tablet (10 mg total) by mouth daily.   Azelastine  HCl 137 MCG/SPRAY Soln Place 2 sprays into both nostrils 2 (two) times daily as needed for rhinitis. Use in each nostril as directed   busPIRone  5 MG tablet Commonly known as: BUSPAR  Take 1 tablet (5 mg total) by mouth 2 (two) times daily.   cetirizine 10 MG  tablet Commonly known as: ZYRTEC Take 10 mg by mouth daily as needed for allergies.   esomeprazole  40 MG capsule Commonly known as: NEXIUM  Take 1 capsule (40 mg total) by mouth daily at 12 noon.   ibuprofen  200 MG tablet Commonly known as: Advil  Take 2 tablets (400 mg total) by mouth every 8 (eight) hours as needed.        Review of Systems  Constitutional:  Negative for appetite change, chills, fatigue, fever and unexpected weight change.  HENT:  Negative for congestion, dental problem, ear discharge, ear pain, facial swelling, hearing loss, nosebleeds, postnasal drip, rhinorrhea, sinus pressure, sinus pain, sneezing, sore throat, tinnitus and trouble swallowing.   Eyes:  Negative for pain, discharge, redness, itching and visual disturbance.  Respiratory:  Negative for cough, chest tightness, shortness of breath and wheezing.   Cardiovascular:  Negative for chest pain, palpitations and leg swelling.  Gastrointestinal:  Negative for abdominal distention, abdominal pain, blood in stool, constipation, diarrhea, nausea and vomiting.  Endocrine: Negative for cold intolerance, heat intolerance, polydipsia, polyphagia and polyuria.  Genitourinary:  Negative for difficulty urinating, dysuria, flank pain, frequency and urgency.  Musculoskeletal:  Negative for arthralgias, back pain, gait problem, joint swelling, myalgias, neck pain and neck stiffness.  Skin:  Negative for color change, pallor, rash and wound.  Neurological:  Negative for dizziness, syncope, speech difficulty, weakness, light-headedness, numbness and headaches.  Hematological:  Does not bruise/bleed easily.  Psychiatric/Behavioral:  Negative for agitation, behavioral problems, confusion, hallucinations, self-injury, sleep disturbance and suicidal ideas. The patient is not nervous/anxious.     Immunization History  Administered Date(s) Administered   Influenza-Unspecified 04/11/2019   Tdap 08/11/2019   Unspecified  SARS-COV-2 Vaccination 03/13/2020, 04/03/2020   Pertinent  Health Maintenance Due  Topic Date Due   Influenza Vaccine  01/11/2025 (Originally 04/02/2024)      07/19/2022    2:56 PM 12/20/2022    3:16 PM 06/06/2023    3:23 PM 06/13/2023   10:49 AM 05/31/2024    3:27 PM  Fall Risk  Falls in the past year? 0 0 0 0 0  Was there an injury with Fall? 0 0   0  Fall Risk Category Calculator 0 0   0  Fall Risk Category (Retired) Low       (RETIRED) Patient Fall Risk Level Low fall risk       Patient at Risk for Falls Due to No Fall Risks No Fall Risks   No Fall Risks  Fall risk Follow up Falls evaluation completed     Falls evaluation completed     Data saved with a previous flowsheet row definition   Functional Status Survey:    Vitals:   05/31/24 1528  BP: 130/82  Pulse: 92  Resp: 19  Temp: 97.8 F (36.6 C)  SpO2: 96%  Weight: 226 lb 3.2 oz (102.6 kg)  Height: 6' (1.829 m)   Body mass index is 30.68 kg/m. Physical Exam Physical Exam  VITALS: T- 97.8, P- 88, BP- 130/82, SaO2- 96% MEASUREMENTS: Weight- 226. GENERAL: Alert, cooperative, well developed, no acute distress. HEENT: Normocephalic, normal oropharynx, moist mucous membranes, tympanic membranes normal, no infection bilaterally, nose normal, no sinus tenderness. NECK: Neck supple. CHEST: Clear to auscultation bilaterally, no wheezes, rhonchi, or crackles, respirations non-labored. CARDIOVASCULAR: Normal heart rate and rhythm, S1 and S2 normal without murmurs. ABDOMEN: Soft, non-tender, non-distended, without organomegaly, normal bowel sounds. EXTREMITIES: No cyanosis or edema. NEUROLOGICAL: Cranial nerves grossly intact, moves all extremities without gross motor or sensory deficit.      Labs reviewed: Recent Labs    06/13/23 1129 11/10/23 0828 05/24/24 0812  NA 136 139 138  K 4.0 4.0 4.1  CL 99 103 101  CO2 26 28 29   GLUCOSE 121 108* 106*  BUN 10 11 12   CREATININE 1.05 0.82 0.97  CALCIUM 10.4* 9.3 9.4    Recent Labs    06/02/23 0804 05/24/24 0812  AST 17 24  ALT 21 20  BILITOT 0.5 0.9  PROT 7.1 7.4   Recent Labs    06/02/23 0804 06/13/23 1129 05/24/24 0812  WBC 4.2 7.0 4.2  NEUTROABS 2,003 3,689 2,092  HGB 14.1 15.5 14.3  HCT 43.3 46.0 43.9  MCV 85.1 83.6 85.2  PLT 215 238 209   Lab Results  Component Value Date   TSH 1.35 05/24/2024   Lab Results  Component Value Date   HGBA1C 5.5 05/24/2024   Lab Results  Component Value Date   CHOL 231 (H) 05/24/2024   HDL 46 05/24/2024   LDLCALC 151 (H) 05/24/2024   TRIG 197 (H) 05/24/2024   CHOLHDL 5.0 (H) 05/24/2024    Significant Diagnostic Results in last 30 days:  No results found.  Assessment/Plan  Post-cholecystectomy diarrhea Diarrhea managed by dietary routine due to bile dumping into the intestine. Concerns about potential GI issues due to family history of colon cancer and personal history of diarrhea. - Place referral to a gastroenterologist for evaluation and potential colonoscopy.  Hyperlipidemia Cholesterol levels remain elevated with LDL at 151 mg/dL and total cholesterol at 231 mg/dL. Triglycerides have improved significantly from 340 mg/dL to 802 mg/dL, indicating positive dietary changes. - Continue dietary modifications to reduce saturated fat intake. - Recheck lipid panel in 6 months.  Essential hypertension Blood pressure is well-controlled at 130/82 mmHg.  Generalized anxiety disorder Anxiety is managed with current medication regimen.  Gastroesophageal reflux disease (GERD) GERD is managed with Nexium  40 mg daily. No new symptoms or complications reported.  Allergic rhinitis Allergic rhinitis is managed with nasal spray as needed. No acute exacerbations reported.   Family/ staff Communication: Reviewed plan of care with patient verbalized understanding   Labs/tests ordered: - CBC with Differential/Platelet - CMP with eGFR([facility]) - TSH - Hgb A1C - Lipid panel   Next Appointment :  Return in about 6 months (around 11/28/2024) for medical mangement of chronic issues., Fasting labs in 6 months prior to visit.   Spent 30 minutes of Face to face and non-face to face with patient  >50% time spent counseling; reviewing medical record; tests; labs; documentation and developing future plan of care.   Roxan JAYSON Plough, NP

## 2024-06-21 ENCOUNTER — Other Ambulatory Visit (HOSPITAL_COMMUNITY): Payer: Self-pay

## 2024-06-21 ENCOUNTER — Other Ambulatory Visit: Payer: Self-pay

## 2024-06-21 ENCOUNTER — Other Ambulatory Visit: Payer: Self-pay | Admitting: Family

## 2024-06-21 DIAGNOSIS — I10 Essential (primary) hypertension: Secondary | ICD-10-CM

## 2024-06-21 MED ORDER — AMLODIPINE BESYLATE 10 MG PO TABS
10.0000 mg | ORAL_TABLET | Freq: Every day | ORAL | 1 refills | Status: AC
  Filled 2024-06-21: qty 90, 90d supply, fill #0
  Filled 2024-09-22: qty 90, 90d supply, fill #1

## 2024-09-17 ENCOUNTER — Encounter: Payer: Self-pay | Admitting: Pediatrics

## 2024-09-22 ENCOUNTER — Other Ambulatory Visit (HOSPITAL_COMMUNITY): Payer: Self-pay

## 2024-09-27 NOTE — Progress Notes (Unsigned)
 [facility] Initial Consultation   Referring Provider Ngetich, Roxan BROCKS, NP [address] [place],  [place] [address]  Primary Care Provider Ngetich, Roxan BROCKS, NP  Patient Profile: Brandon David is a 39 y.o. male who is seen in consultation in the [facility] at the request of Dr. Leonarda for evaluation and management of the problem(s) noted below.  Problem List: GERD Diarrhea Family history of colorectal cancer-mother late 50's Status post cholecystectomy 2/2 biliary dyskinesia   History of Present Illness     Discussed the use of AI scribe software for clinical note transcription with the patient, who gave verbal consent to proceed.  History of Present Illness Brandon David is a 39 year old gentleman with a past medical history noteworthy for HTN, HLD, prediabetes who was referred to the gastroenterology office for evaluation of diarrhea status post cholecystectomy and colorectal cancer screening in the setting of colorectal cancer in a first-degree relative-mother late 50s  Chronic Diarrhea and Bowel Habit Changes: - Persistent loose stools and diarrhea since cholecystectomy in 2019 - Diarrhea most prominent in the mornings - Symptoms are meal- and timing-related - Eating breakfast before a bowel movement results in more formed stools; bowel movement before eating leads to increased morning diarrhea - Salad at lunch exacerbates diarrhea; rarely symptomatic after dinner - After breakfast and coffee, often requires a second bowel movement within an hour, sometimes with very loose, faucet-like stools - Onset of diarrhea closely correlates with cholecystectomy -has not tried bile acid sequestrant's but may consider this in the future  Perianal Irritation and Rectal Bleeding: - Frequent wiping causes perianal irritation - Occasional small amounts of bright red blood on tissue, attributed to local irritation - Uses toilet paper and moist wipes - No melena or  hematochezia  Abdominal Discomfort: - Occasional brief knot or cramping sensation in the upper abdomen in the early morning - Lasts a few minutes and resolves spontaneously - Associated with certain foods or sleeping position - No persistent pain, vomiting, or bowel obstruction symptoms - Discussed that symptoms could represent adhesions related to prior cholecystectomy  Psychosomatic Gastrointestinal Symptoms: - Anxiety or excitement, such as before events, triggers several days of worsened gastrointestinal symptoms described as nervous stomach  Gastroesophageal Reflux Disease: - GERD controlled with Nexium  40 mg daily - Missed doses cause transient discomfort with bending and increased burping - No dysphagia, odynophagia, pyrosis - Reports having a normal EGD in 2019 when being evaluated prior to cholecystectomy  Colorectal Cancer Risk: - Maternal history of colon cancer diagnosed in her late fifties -No other family history of colon cancer or colon polyps - No personal history of colonoscopy - No family history of esophageal cancer or Barrett's esophagus  Substance Use: - Former smoker, currently vapes and is trying to cut down - Consumes approximately two alcoholic beverages per day -reviewed current guidelines regarding alcohol consumption for males and WHO statement about avoiding alcohol - No cannabis use  GI Review of Symptoms Significant for  Loose stool, abdominal cramping.. Otherwise negative.  General Review of Systems  Review of systems is significant for the pertinent positives and negatives as listed per the HPI.  Full ROS is otherwise negative.  Past Medical History   Past Medical History:  Diagnosis Date   Allergy    Annual physical exam 08/11/2019   Anxiety    Elevated blood pressure reading without diagnosis of hypertension 08/11/2019   GERD (gastroesophageal reflux disease)    Hx of cholecystectomy 08/11/2019  Past Surgical History   Past  Surgical History:  Procedure Laterality Date   HERNIA REPAIR     LAPAROSCOPIC CHOLECYSTECTOMY  01/2018   by Dr Vernetta     Allergies and Medications   Allergies[1]   Current Outpatient Medications  Medication Instructions   amLODipine  (NORVASC ) 10 mg, Oral, Daily   azelastine  (ASTELIN ) 0.1 % nasal spray 2 sprays, Each Nare, 2 times daily PRN, Use in each nostril as directed   busPIRone  (BUSPAR ) 5 mg, Oral, 2 times daily   cetirizine (ZYRTEC) 10 mg, Daily PRN   esomeprazole  (NEXIUM ) 40 mg, Oral, Daily   ibuprofen  (ADVIL ) 400 mg, Oral, Every 8 hours PRN     Family History   Family History  Problem Relation Age of Onset   Cancer Maternal Grandmother    COPD Maternal Grandfather    Heart disease Paternal Grandfather      Social History   Social History[2] Brandon David reports that he has quit smoking. His smoking use included e-cigarettes. He has quit using smokeless tobacco. He reports current alcohol use of about 10.0 - 20.0 standard drinks of alcohol per week. He reports current drug use. Frequency: 7.00 times per week. Drug: Marijuana.  Vital Signs and Physical Examination   Vitals:   09/29/24 1459  BP: 128/80  Pulse: 64   Body mass index is 32.36 kg/m. Weight: 238 lb 9.6 oz (108.2 kg)  General: Well developed, well nourished, no acute distress Head: Normocephalic and atraumatic Eyes: Sclerae anicteric, EOMI Lungs: Clear throughout to auscultation Heart: Regular rate and rhythm; No murmurs, rubs or bruits Abdomen: Soft, non tender and non distended. No masses, hepatosplenomegaly or hernias noted. Normal Bowel sounds Rectal: Deferred Musculoskeletal: Symmetrical with no gross deformities    Review of Data  The following data was reviewed at the time of this encounter:  Laboratory Studies      Latest Ref Rng & Units 05/24/2024    8:12 AM 06/13/2023   11:29 AM 06/02/2023    8:04 AM  CBC  WBC 3.8 - 10.8 Thousand/uL 4.2  7.0  4.2   Hemoglobin 13.2 - 17.1 g/dL  85.6  84.4  85.8   Hematocrit 38.5 - 50.0 % 43.9  46.0  43.3   Platelets 140 - 400 Thousand/uL 209  238  215     Lab Results  Component Value Date   LIPASE 26 10/21/2017      Latest Ref Rng & Units 05/24/2024    8:12 AM 11/10/2023    8:28 AM 06/13/2023   11:29 AM  CMP  Glucose 65 - 99 mg/dL 893  891  878   BUN 7 - 25 mg/dL 12  11  10    Creatinine 0.60 - 1.26 mg/dL 9.02  9.17  8.94   Sodium 135 - 146 mmol/L 138  139  136   Potassium 3.5 - 5.3 mmol/L 4.1  4.0  4.0   Chloride 98 - 110 mmol/L 101  103  99   CO2 20 - 32 mmol/L 29  28  26    Calcium 8.6 - 10.3 mg/dL 9.4  9.3  89.5   Total Protein 6.1 - 8.1 g/dL 7.4     Total Bilirubin 0.2 - 1.2 mg/dL 0.9     AST 10 - 40 U/L 24     ALT 9 - 46 U/L 20        Imaging Studies  HIDA scan 12/02/2017 Abnormally low ejection fraction of radiotracer from the gallbladder, a finding indicative of biliary  dyskinesia. Cystic and common bile ducts are patent as is evidenced by visualization of gallbladder and small bowel.   Abdominal ultrasound 12/02/2017 Portions of pancreas obscured by gas. Visualized portions of pancreas appear normal. Study otherwise unremarkable.  No focal liver lesion. Normal hepatic parenchyma.  GI Procedures and Studies   Patient reports an EGD in 2019 that was normal- formal procedure report not available   Clinical Impression  It is my clinical impression that Brandon David is a 39 y.o. male with;  GERD Diarrhea Family history of colorectal cancer-mother late 50's Status post cholecystectomy 2/2 biliary dyskinesia  Brandon David presents for evaluation of multiple gastrointestinal issues as outlined above.  He has a pertinent family history of colorectal cancer in his mother diagnosed in her late 78s.  He is not aware of other family members either immediate or extended with colorectal cancer or polyps.  We reviewed current colorectal cancer screening guidelines that would advise he begin colorectal cancer screening  at age 70.  In terms of GI symptoms, he describes symptoms of diarrhea that occurred shortly after cholecystectomy.  Based upon the history provided today his loose stools sound very consistent with bile acid diarrhea secondary to cholecystectomy.  We discussed the use of bile acid sequestrant's which he prefers to hold off on trying at this time.  He will continue diet modification but is aware that he can contact my office for a trial of Colestid/cholestyramine if he so desires.  He has had rare intermittent episodes of abdominal pain that could be related to abdominal adhesions from his cholecystectomy.  He has a history of GERD that is currently well-controlled on Nexium  40 mg orally daily.  Notes that he has breakthrough symptoms if he misses doses of medication.  No alarm features such as dysphagia or odynophagia.  No family history of Barrett's esophagus or esophageal cancer.  He does have risk factors for Barrett's esophagus with a history of longstanding acid reflux, male gender, obesity and history of tobacco use.  Discussed considering an upper endoscopy at the same time as colonoscopy for Barrett's esophagus screening when he comes due.  Plan  Continue dietary modification for suspected bile acid diarrhea. Offered trial of Colestid/cholestyramine for bile acid diarrhea which he declines today but will reconsider in the future At age 38 schedule colonoscopy for colorectal cancer screening given family history of colorectal cancer in first-degree relative less than 60 years. At the same time as colonoscopy can consider performing an upper endoscopy given that he has risk factors for Barrett's esophagus-longstanding reflux, male gender, obesity and history of smoking Continue Nexium  40 mg orally daily GERD diet and lifestyle modification Reviewed current guidelines regarding alcohol consumption for males-2 or fewer drinks a day; reviewed WHO recommendations regarding avoidance of  alcohol.  Planned Follow Up PRN  The patient or caregiver verbalized understanding of the material covered, with no barriers to understanding. All questions were answered. Patient or caregiver is agreeable with the plan outlined above.    It was a pleasure to see Brandon David.  If you have any questions or concerns regarding this evaluation, do not hesitate to contact me.  Inocente Hausen, MD [facility]   I spent total of 35 minutes in both face-to-face (20 minutes interview) and non-face-to-face (15 minutes chart review, care coordination, documentation)  activities, excluding procedures performed, for the visit on the date of this encounter.     [1] No Known Allergies [2]  Social History Tobacco Use   Smoking status: Former  Types: E-cigarettes   Smokeless tobacco: Former  Building Services Engineer status: Some Days   Start date: 08/10/2016   Last attempt to quit: 08/10/2018  Substance Use Topics   Alcohol use: Yes    Alcohol/week: 10.0 - 20.0 standard drinks of alcohol    Types: 10 - 20 Standard drinks or equivalent per week   Drug use: Yes    Frequency: 7.0 times per week    Types: Marijuana   "

## 2024-09-29 ENCOUNTER — Encounter: Payer: Self-pay | Admitting: Pediatrics

## 2024-09-29 ENCOUNTER — Ambulatory Visit: Admitting: Pediatrics

## 2024-09-29 VITALS — BP 128/80 | HR 64 | Ht 72.0 in | Wt 238.6 lb

## 2024-09-29 DIAGNOSIS — Z800 Family history of malignant neoplasm of digestive organs: Secondary | ICD-10-CM | POA: Diagnosis not present

## 2024-09-29 DIAGNOSIS — K219 Gastro-esophageal reflux disease without esophagitis: Secondary | ICD-10-CM

## 2024-09-29 DIAGNOSIS — R197 Diarrhea, unspecified: Secondary | ICD-10-CM | POA: Diagnosis not present

## 2024-09-29 DIAGNOSIS — Z9049 Acquired absence of other specified parts of digestive tract: Secondary | ICD-10-CM

## 2024-09-29 NOTE — Patient Instructions (Signed)
 Follow up as needed.  Thank you for entrusting me with your care and for choosing [facility], Dr. Inocente Hausen  _______________________________________________________  If your blood pressure at your visit was 140/90 or greater, please contact your primary care physician to follow up on this.  _______________________________________________________  If you are age 39 or older, your body mass index should be between 23-30. Your Body mass index is 32.36 kg/m. If this is out of the aforementioned range listed, please consider follow up with your Primary Care Provider.  If you are age 17 or younger, your body mass index should be between 19-25. Your Body mass index is 32.36 kg/m. If this is out of the aformentioned range listed, please consider follow up with your Primary Care Provider.   ________________________________________________________  The [facility] providers would like to encourage you to use MYCHART to communicate with providers for non-urgent requests or questions.  Due to long hold times on the telephone, sending your provider a message by [facility] may be a faster and more efficient way to get a response.  Please allow 48 business hours for a response.  Please remember that this is for non-urgent requests.  _______________________________________________________  [facility] is using a team-based approach to care.  Your team is made up of your doctor and two to three APPS. Our APPS (Nurse Practitioners and Physician Assistants) work with your physician to ensure care continuity for you. They are fully qualified to address your health concerns and develop a treatment plan. They communicate directly with your gastroenterologist to care for you. Seeing the Advanced Practice Practitioners on your physician's team can help you by facilitating care more promptly, often allowing for earlier appointments, access to diagnostic testing, procedures, and other specialty  referrals.

## 2024-10-01 ENCOUNTER — Encounter: Payer: Self-pay | Admitting: Pediatrics

## 2024-10-01 ENCOUNTER — Ambulatory Visit: Admitting: Pediatrics

## 2024-10-01 VITALS — BP 138/92 | HR 82 | Ht 72.0 in | Wt 239.0 lb

## 2024-10-01 DIAGNOSIS — K649 Unspecified hemorrhoids: Secondary | ICD-10-CM | POA: Diagnosis not present

## 2024-10-01 DIAGNOSIS — R197 Diarrhea, unspecified: Secondary | ICD-10-CM | POA: Diagnosis not present

## 2024-10-01 DIAGNOSIS — R21 Rash and other nonspecific skin eruption: Secondary | ICD-10-CM

## 2024-10-01 DIAGNOSIS — Z800 Family history of malignant neoplasm of digestive organs: Secondary | ICD-10-CM | POA: Diagnosis not present

## 2024-10-01 DIAGNOSIS — K219 Gastro-esophageal reflux disease without esophagitis: Secondary | ICD-10-CM | POA: Diagnosis not present

## 2024-10-01 NOTE — Patient Instructions (Addendum)
 You may use Recticare over the counter twice a day, up to four times a day as need for hemorrhoidal irritation.  You may use Zinc Oxide or Calmoseptine cream on the surrounding area as needed  Please follow up as needed.  _______________________________________________________  If your blood pressure at your visit was 140/90 or greater, please contact your primary care physician to follow up on this.  _______________________________________________________  If you are age 14 or older, your body mass index should be between 23-30. Your Body mass index is 32.41 kg/m. If this is out of the aforementioned range listed, please consider follow up with your Primary Care Provider.  If you are age 41 or younger, your body mass index should be between 19-25. Your Body mass index is 32.41 kg/m. If this is out of the aformentioned range listed, please consider follow up with your Primary Care Provider.   ________________________________________________________  The [facility] providers would like to encourage you to use MYCHART to communicate with providers for non-urgent requests or questions.  Due to long hold times on the telephone, sending your provider a message by [facility] may be a faster and more efficient way to get a response.  Please allow 48 business hours for a response.  Please remember that this is for non-urgent requests.  _______________________________________________________  [facility] is using a team-based approach to care.  Your team is made up of your doctor and two to three APPS. Our APPS (Nurse Practitioners and Physician Assistants) work with your physician to ensure care continuity for you. They are fully qualified to address your health concerns and develop a treatment plan. They communicate directly with your gastroenterologist to care for you. Seeing the Advanced Practice Practitioners on your physician's team can help you by facilitating care more promptly, often  allowing for earlier appointments, access to diagnostic testing, procedures, and other specialty referrals.  High-Fiber Diet Fiber, also called dietary fiber, is found in foods such as fruits, vegetables, whole grains, and beans. A high-fiber diet can be good for your health. Your health care provider may recommend a high-fiber diet to help: Prevent trouble pooping (constipation). Lower your cholesterol. Treat the following conditions: Hemorrhoids. This is inflammation of veins in the anus. Inflammation of specific areas of the digestive tract. Irritable bowel syndrome (IBS). This is a problem of the large intestine, also called the colon, that sometimes causes belly pain and bloating. Prevent overeating as part of a weight-loss plan. Lower the risk of heart disease, type 2 diabetes, and certain cancers. What are tips for following this plan? Reading food labels  Check the nutrition facts label on foods for the amount of dietary fiber. Choose foods that have 4 grams of fiber or more per serving. The recommended goals for how much fiber you should eat each day include: Males 55 years old or younger: 30-34 g. Males over 40 years old: 28-34 g. Females 75 years old or younger: 25-28 g. Females over 47 years old: 22-25 g. Your daily fiber goal is _____________ g. Shopping Choose whole fruits and vegetables instead of processed. For example, choose apples instead of apple juice or applesauce. Choose a variety of high-fiber foods such as avocados, lentils, oats, and pinto beans. Read the nutrition facts label on foods. Check for foods with added fiber. These foods often have high sugar and salt (sodium) amounts per serving. Cooking Use whole-grain flour for baking and cooking. Cook with brown rice instead of white rice. Make meals that have a lot of beans  and vegetables in them, such as chili or vegetable-based soups. Meal planning Start the day with a breakfast that is high in fiber, such as  a cereal that has 5 g of fiber or more per serving. Eat breads and cereals that are made with whole-grain flour instead of refined flour or white flour. Eat brown rice, bulgur wheat, or millet instead of white rice. Use beans in place of meat in soups, salads, and pasta dishes. Be sure that half of the grains you eat each day are whole grains. General information You can get the recommended amount of dietary fiber by: Eating a variety of fruits, vegetables, grains, nuts, and beans. Taking a fiber supplement if you aren't able to eat enough fiber. It's better to get fiber through food than from a supplement. Slowly increase how much fiber you eat. If you increase the amount of fiber you eat too quickly, you may have bloating, cramping, or gas. Drink plenty of water to help you digest fiber. Choose high-fiber snacks, such as berries, raw vegetables, nuts, and popcorn. What foods should I eat? Fruits Berries. Pears. Apples. Oranges. Avocado. Prunes and raisins. Dried figs. Vegetables Sweet potatoes. Spinach. Kale. Artichokes. Cabbage. Broccoli. Cauliflower. Green peas. Carrots. Squash. Grains Whole-grain breads. Multigrain cereal. Oats and oatmeal. Brown rice. Barley. Bulgur wheat. Millet. Quinoa. Bran muffins. Popcorn. Rye wafer crackers. Meats and other proteins Navy beans, kidney beans, and pinto beans. Soybeans. Split peas. Lentils. Nuts and seeds. Dairy Fiber-fortified yogurt. Fortified means that fiber has been added to the product. Beverages Fiber-fortified soy milk. Fiber-fortified orange juice. Other foods Fiber bars. The items listed above may not be all the foods and drinks you can have. Talk to a dietitian to learn more. What foods should I avoid? Fruits Fruit juice. Cooked, strained fruit. Vegetables Fried potatoes. Canned vegetables. Well-cooked vegetables. Grains White bread. Pasta made with refined flour. White rice. Meats and other proteins Fatty meat. Fried chicken  or fried fish. Dairy Milk. Cream cheese. Sour cream. Fats and oils Butters. Beverages Soft drinks. Other foods Cakes and pastries. The items listed above may not be all the foods and drinks you should avoid. Talk to a dietitian to learn more. This information is not intended to replace advice given to you by your health care provider. Make sure you discuss any questions you have with your health care provider. Document Revised: 06/28/2024 Document Reviewed: 11/11/2022 Elsevier Patient Education  2025 [employer].

## 2024-10-01 NOTE — Telephone Encounter (Signed)
 Spoke with patient & he plans to come today at 3:00 pm for OV with Dr. Suzann.

## 2024-10-02 NOTE — Progress Notes (Signed)
 [facility]   Referring Provider Ngetich, Roxan BROCKS, NP [address] [place],  [place] [address]  Primary Care Provider Ngetich, Roxan BROCKS, NP  Patient Profile: Brandon David is a 39 y.o. male who returns to the [facility] office for follow-up of the problem(s) noted below.  Problem List: Hemorrhoids and perianal symptoms GERD Diarrhea Family history of colorectal cancer-mother late 50's Status post cholecystectomy 2/2 biliary dyskinesia   History of Present Illness     Discussed the use of AI scribe software for clinical note transcription with the patient, who gave verbal consent to proceed.  History of Present Illness Mr. Brandon David is a 39 year old gentleman with a past medical history noteworthy for HTN, HLD, prediabetes who returns to the gastroenterology office for hemorrhoidal and perianal concerns  Current GI Meds  Nexium  40 mg orally daily  Interval History   Concern for Prolapsed Hemorrhoid and Perianal Symptoms - Deforest was seen in the office 2 days ago at which time he commented on perianal irritation but was not overly concerned about symptoms - Endorses persistent perianal irritation attributed to frequent, aggressive wiping after bowel movements - Associated with itching and discomfort - Symptoms improve with gentler hygiene and avoidance of scratching - No significant discomfort at present  - This morning he reported a damp spot in underwear within minutes of changing - Moisture is not sweat and contains no blood - Small, bright red, spotty blood on toilet paper with wiping - No other rectal bleeding  - Self-inspection revealed marked perianal erythema and a prolapsed hemorrhoid described as like a grape - He contacted our office describing symptoms of prolapse and it was difficult to discern if he was experiencing a prolapsed hemorrhoid versus rectal prolapse-advised that he come to the office for exam - Denies  perianal discomfort - Avoided bowel movements for approximately 36 hours due to anticipated pain - Has not had any change in symptoms over the course of the day while awaiting his appointment  - On physical exam he has erythematous perianal skin -likely contact irritation but fungal infection is also consideration, superficial fissuring in the gluteal cleft - A nonthrombosed prolapsing hemorrhoid is present on exam; palpable hemorrhoid also noted on internal digital rectal examination    GI Review of Symptoms Significant for perianal moisture and external hemorrhoid otherwise negative.  General Review of Systems  Review of systems is significant for the pertinent positives and negatives as listed per the HPI.  Full ROS is otherwise negative.  Past Medical History   Past Medical History:  Diagnosis Date   Allergy    Annual physical exam 08/11/2019   Anxiety    Elevated blood pressure reading without diagnosis of hypertension 08/11/2019   GERD (gastroesophageal reflux disease)    Hx of cholecystectomy 08/11/2019     Past Surgical History   Past Surgical History:  Procedure Laterality Date   HERNIA REPAIR     LAPAROSCOPIC CHOLECYSTECTOMY  01/2018   by Dr Vernetta     Allergies and Medications   Allergies[1]   Current Outpatient Medications  Medication Instructions   amLODipine  (NORVASC ) 10 mg, Oral, Daily   azelastine  (ASTELIN ) 0.1 % nasal spray 2 sprays, Each Nare, 2 times daily PRN, Use in each nostril as directed   busPIRone  (BUSPAR ) 5 mg, Oral, 2 times daily   cetirizine (ZYRTEC) 10 mg, Daily PRN   esomeprazole  (NEXIUM ) 40 mg, Oral, Daily   ibuprofen  (ADVIL ) 400 mg, Oral, Every 8 hours PRN  Family History   Family History  Problem Relation Age of Onset   Cancer Maternal Grandmother    COPD Maternal Grandfather    Heart disease Paternal Grandfather      Social History   Social History[2] Zaedyn reports that he has quit smoking. His smoking use included  e-cigarettes. He has quit using smokeless tobacco. He reports current alcohol use of about 10.0 - 20.0 standard drinks of alcohol per week. He reports current drug use. Frequency: 7.00 times per week. Drug: Marijuana.  Vital Signs and Physical Examination   Vitals:   10/01/24 1503  BP: (!) 138/92  Pulse: 82  SpO2: 97%   Body mass index is 32.41 kg/m. Weight: 239 lb (108.4 kg)  General: Well developed, well nourished, no acute distress Head: Normocephalic and atraumatic Eyes: Sclerae anicteric, EOMI Lungs: Clear throughout to auscultation Heart: Regular rate and rhythm; No murmurs, rubs or bruits Abdomen: Soft, non tender and non distended. No masses, hepatosplenomegaly or hernias noted. Normal Bowel sounds Rectal: Erythematous perianal skin with mild fissuring in gluteal cleft, nonthrombosed prolapsing hemorrhoid, palpable hemorrhoid present on internal digital rectal examination Musculoskeletal: Symmetrical with no gross deformities   Butler Nevin, CMA present as chaperone for perianal inspection and rectal exam  Review of Data  The following data was reviewed at the time of this encounter:  Laboratory Studies      Latest Ref Rng & Units 05/24/2024    8:12 AM 06/13/2023   11:29 AM 06/02/2023    8:04 AM  CBC  WBC 3.8 - 10.8 Thousand/uL 4.2  7.0  4.2   Hemoglobin 13.2 - 17.1 g/dL 85.6  84.4  85.8   Hematocrit 38.5 - 50.0 % 43.9  46.0  43.3   Platelets 140 - 400 Thousand/uL 209  238  215     Lab Results  Component Value Date   LIPASE 26 10/21/2017      Latest Ref Rng & Units 05/24/2024    8:12 AM 11/10/2023    8:28 AM 06/13/2023   11:29 AM  CMP  Glucose 65 - 99 mg/dL 893  891  878   BUN 7 - 25 mg/dL 12  11  10    Creatinine 0.60 - 1.26 mg/dL 9.02  9.17  8.94   Sodium 135 - 146 mmol/L 138  139  136   Potassium 3.5 - 5.3 mmol/L 4.1  4.0  4.0   Chloride 98 - 110 mmol/L 101  103  99   CO2 20 - 32 mmol/L 29  28  26    Calcium 8.6 - 10.3 mg/dL 9.4  9.3  89.5   Total  Protein 6.1 - 8.1 g/dL 7.4     Total Bilirubin 0.2 - 1.2 mg/dL 0.9     AST 10 - 40 U/L 24     ALT 9 - 46 U/L 20        Imaging Studies  HIDA scan 12/02/2017 Abnormally low ejection fraction of radiotracer from the gallbladder, a finding indicative of biliary dyskinesia. Cystic and common bile ducts are patent as is evidenced by visualization of gallbladder and small bowel.   Abdominal ultrasound 12/02/2017 Portions of pancreas obscured by gas. Visualized portions of pancreas appear normal. Study otherwise unremarkable.  No focal liver lesion. Normal hepatic parenchyma.  GI Procedures and Studies   Patient reports an EGD in 2019 that was normal- formal procedure report not available   Clinical Impression  It is my clinical impression that Mr. Klugh is a 39 y.o. male  with;  Hemorrhoids and perianal symptoms GERD Diarrhea Family history of colorectal cancer-mother late 50's Status post cholecystectomy 2/2 biliary dyskinesia  Mr. Puckett presents for an acute visit today to address concerns regarding hemorrhoids and perianal symptoms.  At the time of recent clinic visit he endorsed perianal irritation and pruritus that he ascribed to vigorous wiping and attempt to maintain perianal hygiene.  This morning he developed passage of moisture in his underwear shortly after getting dressed.  He also palpated prolapsed tissue in his perianal region without significant pain or tenderness.  He messaged our office and it was difficult to discern if he was describing a prolapsed hemorrhoid versus rectal prolapse.  He therefore presented for physical examination today.  On exam he has erythematous skin in the gluteal cleft which could be due to irritation from wiping or possibly fungal in etiology.  There was also evidence of a nonthrombosed prolapsing hemorrhoid and palpable hemorrhoid internally.  Discussed that some of the leakage of moisture in his underwear could be related to prolapse from the  hemorrhoid.  Advised conservative topical medical management and if this fails can consider referral to colorectal surgery in the future.  Plan  Samples provided of RectiCare advance for patient to apply to prolapsed hemorrhoid in an effort to shrink it in size Advised to use of zinc oxide and Calmoseptine ointments to the perianal region to sooth the irritated perianal tissue -if this is ineffective consider trial of an antifungal agent Information provided regarding a high-fiber diet Limit constipation and can use stool softener such as Colace or MiraLAX if needed If hemorrhoidal symptoms are not improving in the future consider if there is a need for endoscopic evaluation and/or colorectal surgery evaluation.  Planned Follow Up PRN  The patient or caregiver verbalized understanding of the material covered, with no barriers to understanding. All questions were answered. Patient or caregiver is agreeable with the plan outlined above.    It was a pleasure to see Bowe.  If you have any questions or concerns regarding this evaluation, do not hesitate to contact me.  Inocente Hausen, MD  Gastroenterology   I spent total of 30 minutes in both face-to-face (20 minutes interview) and non-face-to-face (10 minutes chart review, care coordination, documentation)  activities, excluding procedures performed, for the visit on the date of this encounter.      [1] No Known Allergies [2]  Social History Tobacco Use   Smoking status: Former    Types: E-cigarettes   Smokeless tobacco: Former  Building Services Engineer status: Some Days   Start date: 08/10/2016   Last attempt to quit: 08/10/2018  Substance Use Topics   Alcohol use: Yes    Alcohol/week: 10.0 - 20.0 standard drinks of alcohol    Types: 10 - 20 Standard drinks or equivalent per week   Drug use: Yes    Frequency: 7.0 times per week    Types: Marijuana   "

## 2024-11-15 ENCOUNTER — Other Ambulatory Visit: Payer: Self-pay

## 2024-11-19 ENCOUNTER — Ambulatory Visit: Payer: Self-pay | Admitting: Family
